# Patient Record
Sex: Female | Born: 1950
Health system: Southern US, Community
[De-identification: ages and names within clinical notes are randomized; demographics above are authoritative.]

## PROBLEM LIST (undated history)

## (undated) DIAGNOSIS — F32A Depression, unspecified: Secondary | ICD-10-CM

## (undated) DIAGNOSIS — F329 Major depressive disorder, single episode, unspecified: Secondary | ICD-10-CM

## (undated) DIAGNOSIS — E079 Disorder of thyroid, unspecified: Secondary | ICD-10-CM

## (undated) DIAGNOSIS — M1990 Unspecified osteoarthritis, unspecified site: Secondary | ICD-10-CM

## (undated) DIAGNOSIS — I10 Essential (primary) hypertension: Secondary | ICD-10-CM

## (undated) DIAGNOSIS — N059 Unspecified nephritic syndrome with unspecified morphologic changes: Secondary | ICD-10-CM

## (undated) HISTORY — DX: Unspecified nephritic syndrome with unspecified morphologic changes: N05.9

## (undated) HISTORY — DX: Major depressive disorder, single episode, unspecified: F32.9

## (undated) HISTORY — DX: Morbid (severe) obesity due to excess calories: E66.01

## (undated) HISTORY — DX: Depression, unspecified: F32.A

## (undated) HISTORY — DX: Disorder of thyroid, unspecified: E07.9

## (undated) HISTORY — DX: Unspecified osteoarthritis, unspecified site: M19.90

## (undated) HISTORY — DX: Essential (primary) hypertension: I10

---
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
## 1998-03-06 HISTORY — PX: JOINT REPLACEMENT: SHX530

## 1998-03-29 ENCOUNTER — Inpatient Hospital Stay (HOSPITAL_COMMUNITY): Admission: RE | Admit: 1998-03-29 | Discharge: 1998-04-02 | Payer: Self-pay | Admitting: Orthopedic Surgery

## 1998-04-05 ENCOUNTER — Encounter (HOSPITAL_COMMUNITY): Admission: RE | Admit: 1998-04-05 | Discharge: 1998-07-04 | Payer: Self-pay | Admitting: Orthopedic Surgery

## 1998-04-08 ENCOUNTER — Emergency Department (HOSPITAL_COMMUNITY): Admission: EM | Admit: 1998-04-08 | Discharge: 1998-04-08 | Payer: Self-pay | Admitting: Emergency Medicine

## 2002-05-04 ENCOUNTER — Ambulatory Visit (HOSPITAL_COMMUNITY): Admission: RE | Admit: 2002-05-04 | Discharge: 2002-05-04 | Payer: Self-pay | Admitting: Anesthesiology

## 2013-05-19 ENCOUNTER — Ambulatory Visit: Payer: No Typology Code available for payment source | Attending: Internal Medicine | Admitting: Internal Medicine

## 2013-05-19 ENCOUNTER — Telehealth: Payer: Self-pay | Admitting: Emergency Medicine

## 2013-05-19 ENCOUNTER — Encounter: Payer: Self-pay | Admitting: Internal Medicine

## 2013-05-19 ENCOUNTER — Ambulatory Visit
Admission: RE | Admit: 2013-05-19 | Discharge: 2013-05-19 | Disposition: A | Discharge status: Home / Self Care | Payer: No Typology Code available for payment source | Source: Ambulatory Visit | Attending: Internal Medicine | Admitting: Internal Medicine

## 2013-05-19 VITALS — BP 153/90 | HR 60 | Temp 97.7°F | Ht 66.0 in | Wt 309.2 lb

## 2013-05-19 DIAGNOSIS — F329 Major depressive disorder, single episode, unspecified: Secondary | ICD-10-CM

## 2013-05-19 DIAGNOSIS — Z0000 Encounter for general adult medical examination without abnormal findings: Secondary | ICD-10-CM

## 2013-05-19 DIAGNOSIS — I10 Essential (primary) hypertension: Secondary | ICD-10-CM | POA: Insufficient documentation

## 2013-05-19 DIAGNOSIS — M170 Bilateral primary osteoarthritis of knee: Secondary | ICD-10-CM

## 2013-05-19 DIAGNOSIS — E039 Hypothyroidism, unspecified: Secondary | ICD-10-CM

## 2013-05-19 DIAGNOSIS — IMO0002 Reserved for concepts with insufficient information to code with codable children: Secondary | ICD-10-CM

## 2013-05-19 DIAGNOSIS — M1710 Unilateral primary osteoarthritis, unspecified knee: Secondary | ICD-10-CM | POA: Insufficient documentation

## 2013-05-19 LAB — CBC WITH DIFFERENTIAL/PLATELET
Basophils Absolute: 0 10*3/uL (ref 0.0–0.1)
Basophils Relative: 1 % (ref 0–1)
HCT: 35.1 % — ABNORMAL LOW (ref 36.0–46.0)
Lymphocytes Relative: 15 % (ref 12–46)
MCHC: 33.3 g/dL (ref 30.0–36.0)
MCV: 72.5 fL — ABNORMAL LOW (ref 78.0–100.0)
Monocytes Absolute: 0.6 10*3/uL (ref 0.1–1.0)
Neutro Abs: 6.3 10*3/uL (ref 1.7–7.7)
Neutrophils Relative %: 72 % (ref 43–77)
Platelets: 167 10*3/uL (ref 150–400)
RBC: 4.84 MIL/uL (ref 3.87–5.11)
RDW: 15.5 % (ref 11.5–15.5)
WBC: 8.8 10*3/uL (ref 4.0–10.5)

## 2013-05-19 LAB — CMP AND LIVER
Albumin: 3.7 g/dL (ref 3.5–5.2)
Bilirubin, Direct: 0.1 mg/dL (ref 0.0–0.3)
CO2: 26 mEq/L (ref 19–32)
Calcium: 9 mg/dL (ref 8.4–10.5)
Creat: 1.56 mg/dL — ABNORMAL HIGH (ref 0.50–1.10)
Glucose, Bld: 92 mg/dL (ref 70–99)
Indirect Bilirubin: 0.5 mg/dL (ref 0.0–0.9)
Potassium: 4.8 mEq/L (ref 3.5–5.3)
Sodium: 137 mEq/L (ref 135–145)
Total Protein: 7.2 g/dL (ref 6.0–8.3)

## 2013-05-19 LAB — LIPID PANEL
HDL: 42 mg/dL (ref >39)
Triglycerides: 233 mg/dL — ABNORMAL HIGH (ref <150)
VLDL: 47 mg/dL — ABNORMAL HIGH (ref 0–40)

## 2013-05-19 LAB — POCT GLYCOSYLATED HEMOGLOBIN (HGB A1C): Hemoglobin A1C: 5.4

## 2013-05-19 MED ORDER — BUPROPION HCL ER (XL) 150 MG PO TB24: 150.0000 mg | ORAL_TABLET | ORAL | Status: DC

## 2013-05-19 NOTE — Progress Notes (Signed)
Patient ID: Frances Graves, female   DOB: 1951-01-12, 62 y.o.   MRN: [id number] Patient Demographics  Frances Graves, is a 62 y.o. female  [id number]  [id number]  DOB - 1951-05-04  CC:  Chief Complaint  Patient presents with  . Establish Care  . Knee Pain       HPI: Frances Graves is a 62 y.o. female here today to establish medical care. She has history of hypertension, osteoarthritis of both knees, hypothyroidism, and major depression. She has not seen a doctor for a while because she lost her insurance. Her medications are only been refilled by physician assistance. She has not had a physical for about 5 years. She has no specific complaint today, she recently had a refill of her medications, but she thinks her antidepressant is not working, she's not motivated to do anything, she lives with her mom who does the cooking. She claims to be independent, can do grocery shopping, but she is just not motivated to do anything. Mostly this is due to her knee pain especially when walking, her knees touched each other, painful on walking. This also precludes her from doing any exercise. She has been on antidepressant for long time, she thinks she now gets used to it and is not helping anymore. She denied being suicidal, she said depression runs in her family. She is not known to have diabetes, no history of heart attack. She does not smoke cigarette, she does not drink alcohol. She does not have children of her own. She had right hip replacement 14 years ago Patient has No headache, No chest pain, No abdominal pain - No Nausea, No new weakness tingling or numbness, No Cough - SOB.  Allergies  Allergen Reactions  . Codeine   . Iodinated Diagnostic Agents   . Mercury   . Morphine And Related   . Tape    No past medical history on file. No current outpatient prescriptions on file prior to visit.   No current facility-administered medications on file prior to visit.   Family History  Problem  Relation Age of Onset  . Cancer Father   . Diabetes Sister   . Hypertension Brother    History   Social History  . Marital Status: Single    Spouse Name: N/A    Number of Children: N/A  . Years of Education: N/A   Occupational History  . Not on file.   Social History Main Topics  . Smoking status: Never Smoker   . Smokeless tobacco: Not on file  . Alcohol Use: Not on file  . Drug Use: Not on file  . Sexual Activity: Not on file   Other Topics Concern  . Not on file   Social History Narrative  . No narrative on file    Review of Systems: Constitutional: Negative for fever, chills, diaphoresis, activity change, appetite change and fatigue. HENT: Negative for ear pain, nosebleeds, congestion, facial swelling, rhinorrhea, neck pain, neck stiffness and ear discharge.  Eyes: Negative for pain, discharge, redness, itching and visual disturbance. Respiratory: Negative for cough, choking, chest tightness, shortness of breath, wheezing and stridor.  Cardiovascular: Negative for chest pain, palpitations and leg swelling. Gastrointestinal: Negative for abdominal distention. Genitourinary: Negative for dysuria, urgency, frequency, hematuria, flank pain, decreased urine volume, difficulty urinating and dyspareunia.  Musculoskeletal: Negative for back pain, bilateral knee pain  Neurological: Negative for dizziness, tremors, seizures, syncope, facial asymmetry, speech difficulty, weakness, light-headedness, numbness and headaches.  Hematological: Negative for adenopathy. Does not  bruise/bleed easily. Psychiatric/Behavioral: She has major depression, lacks motivation to engage in any activity Negative for hallucinations, confusion, decreased concentration and agitation.    Objective:   Filed Vitals:   05/19/13 1215  BP: 153/90  Pulse: 60  Temp: 97.7 F (36.5 C)    Physical Exam: Constitutional: Patient appears well-developed and well-nourished. No distress. Morbidly obese HENT:  Normocephalic, atraumatic, External right and left ear normal. Oropharynx is clear and moist.  Eyes: Conjunctivae and EOM are normal. PERRLA, no scleral icterus. Neck: Normal ROM. Neck supple. No JVD. No tracheal deviation. No thyromegaly. CVS: RRR, S1/S2 +, no murmurs, no gallops, no carotid bruit.  Pulmonary: Effort and breath sounds normal, no stridor, rhonchi, wheezes, rales.  Abdominal: Soft. BS +, no distension, tenderness, rebound or guarding.  Musculoskeletal: Bilateral knee tenderness on flexion and extension  Lymphadenopathy: No lymphadenopathy noted, cervical, inguinal or axillary Neuro: Alert. Normal reflexes, muscle tone coordination. No cranial nerve deficit. Skin: Skin is warm and dry. No rash noted. Not diaphoretic. No erythema. No pallor. Psychiatric: Poor eye contact, judgment, thought content normal.  No results found for this basename: WBC, HGB, HCT, MCV, PLT   No results found for this basename: CREATININE, BUN, NA, K, CL, CO2    No results found for this basename: HGBA1C   Lipid Panel  No results found for this basename: chol, trig, hdl, cholhdl, vldl, ldlcalc       Assessment and plan:   Patient Active Problem List   Diagnosis Date Noted  . Osteoarthritis of both knees 05/19/2013  . Morbid obesity 05/19/2013  . Essential hypertension, benign 05/19/2013  . Major depression, chronic 05/19/2013  . Unspecified hypothyroidism 05/19/2013  . Annual physical exam 05/19/2013    Plan: Bupropion ER 150 mg tablet by mouth daily Continue other medications as prescribed   Labs:  Comprehensive metabolic panel  Complete blood count and differentials  Lipid panel  Complete urinalysis  TSH  Hemoglobin A1c  X-ray both knees  Colonoscopy  Mammogram  Patient extensively counseled about nutrition and exercise Patient counseled about depression, she declined referral to psychiatrist, was to trying the addition Wellbutrin and see. We discussed blood  pressure goals, compliance with medication, complications of uncontrolled hypertension. Since we do not have baseline kidney functions, would defer adjustment of blood pressure medications until we have results of comprehensive metabolic panel.      Health Maintenance -Colonoscopy: Referral Ordered -Pap Smear: Referral to family clinic -Mammogram: Referral Ordered -Vaccinations:   -Influenza given today  Follow up in 2 months   The patient was given clear instructions to go to ER or return to medical center if symptoms don't improve, worsen or new problems develop. The patient verbalized understanding. The patient was told to call to get lab results if they haven't heard anything in the next week.     Jeanann Lewandowsky, MD, MHA, FACP, FAAP [facility] [place], [place] [phone]   05/19/2013, 12:51 PM

## 2013-05-19 NOTE — Telephone Encounter (Signed)
Pt called in to clarify not to take Citalopram with prescribed Wellbutrin per provider

## 2013-05-19 NOTE — Patient Instructions (Signed)
Hypertension As your heart beats, it forces blood through your arteries. This force is your blood pressure. If the pressure is too high, it is called hypertension (HTN) or high blood pressure. HTN is dangerous because you may have it and not know it. High blood pressure may mean that your heart has to work harder to pump blood. Your arteries may be narrow or stiff. The extra work puts you at risk for heart disease, stroke, and other problems.  Blood pressure consists of two numbers, a higher number over a lower, 110/72, for example. It is stated as "110 over 72." The ideal is below 120 for the top number (systolic) and under 80 for the bottom (diastolic). Write down your blood pressure today. You should pay close attention to your blood pressure if you have certain conditions such as:  Heart failure.  Prior heart attack.  Diabetes  Chronic kidney disease.  Prior stroke.  Multiple risk factors for heart disease. To see if you have HTN, your blood pressure should be measured while you are seated with your arm held at the level of the heart. It should be measured at least twice. A one-time elevated blood pressure reading (especially in the Emergency Department) does not mean that you need treatment. There may be conditions in which the blood pressure is different between your right and left arms. It is important to see your caregiver soon for a recheck. Most people have essential hypertension which means that there is not a specific cause. This type of high blood pressure may be lowered by changing lifestyle factors such as:  Stress.  Smoking.  Lack of exercise.  Excessive weight.  Drug/tobacco/alcohol use.  Eating less salt. Most people do not have symptoms from high blood pressure until it has caused damage to the body. Effective treatment can often prevent, delay or reduce that damage. TREATMENT  When a cause has been identified, treatment for high blood pressure is directed at the  cause. There are a large number of medications to treat HTN. These fall into several categories, and your caregiver will help you select the medicines that are best for you. Medications may have side effects. You should review side effects with your caregiver. If your blood pressure stays high after you have made lifestyle changes or started on medicines,   Your medication(s) may need to be changed.  Other problems may need to be addressed.  Be certain you understand your prescriptions, and know how and when to take your medicine.  Be sure to follow up with your caregiver within the time frame advised (usually within two weeks) to have your blood pressure rechecked and to review your medications.  If you are taking more than one medicine to lower your blood pressure, make sure you know how and at what times they should be taken. Taking two medicines at the same time can result in blood pressure that is too low. SEEK IMMEDIATE MEDICAL CARE IF:  You develop a severe headache, blurred or changing vision, or confusion.  You have unusual weakness or numbness, or a faint feeling.  You have severe chest or abdominal pain, vomiting, or breathing problems. MAKE SURE YOU:   Understand these instructions.  Will watch your condition.  Will get help right away if you are not doing well or get worse. Document Released: 07/23/2005 Document Revised: 10/15/2011 Document Reviewed: 03/12/2008 [facility] Patient Information 2014 [place]. DASH Diet The DASH diet stands for "Dietary Approaches to Stop Hypertension." It is a healthy  eating plan that has been shown to reduce high blood pressure (hypertension) in as little as 14 days, while also possibly providing other significant health benefits. These other health benefits include reducing the risk of breast cancer after menopause and reducing the risk of type 2 diabetes, heart disease, colon cancer, and stroke. Health benefits also include weight loss  and slowing kidney failure in patients with chronic kidney disease.  DIET GUIDELINES  Limit salt (sodium). Your diet should contain less than 1500 mg of sodium daily.  Limit refined or processed carbohydrates. Your diet should include mostly whole grains. Desserts and added sugars should be used sparingly.  Include small amounts of heart-healthy fats. These types of fats include nuts, oils, and tub margarine. Limit saturated and trans fats. These fats have been shown to be harmful in the body. CHOOSING FOODS  The following food groups are based on a 2000 calorie diet. See your Registered Dietitian for individual calorie needs. Grains and Grain Products (6 to 8 servings daily)  Eat More Often: Whole-wheat bread, brown rice, whole-grain or wheat pasta, quinoa, popcorn without added fat or salt (air popped).  Eat Less Often: White bread, white pasta, white rice, cornbread. Vegetables (4 to 5 servings daily)  Eat More Often: Fresh, frozen, and canned vegetables. Vegetables may be raw, steamed, roasted, or grilled with a minimal amount of fat.  Eat Less Often/Avoid: Creamed or fried vegetables. Vegetables in a cheese sauce. Fruit (4 to 5 servings daily)  Eat More Often: All fresh, canned (in natural juice), or frozen fruits. Dried fruits without added sugar. One hundred percent fruit juice ( cup [237 mL] daily).  Eat Less Often: Dried fruits with added sugar. Canned fruit in light or heavy syrup. [employer], Fish, and Poultry (2 servings or less daily. One serving is 3 to 4 oz [85-114 g]).  Eat More Often: Ninety percent or leaner ground beef, tenderloin, sirloin. Round cuts of beef, chicken breast, [place] breast. All fish. Grill, bake, or broil your meat. Nothing should be fried.  Eat Less Often/Avoid: Fatty cuts of meat, [place], or chicken leg, thigh, or wing. Fried cuts of meat or fish. Dairy (2 to 3 servings)  Eat More Often: Low-fat or fat-free milk, low-fat plain or light yogurt,  reduced-fat or part-skim cheese.  Eat Less Often/Avoid: Milk (whole, 2%).Whole milk yogurt. Full-fat cheeses. Nuts, Seeds, and Legumes (4 to 5 servings per week)  Eat More Often: All without added salt.  Eat Less Often/Avoid: Salted nuts and seeds, canned beans with added salt. Fats and Sweets (limited)  Eat More Often: Vegetable oils, tub margarines without trans fats, sugar-free gelatin. Mayonnaise and salad dressings.  Eat Less Often/Avoid: Coconut oils, palm oils, butter, stick margarine, cream, half and half, cookies, candy, pie. FOR MORE INFORMATION The Dash Diet Eating Plan: www.dashdiet.org Document Released: 07/12/2011 Document Revised: 10/15/2011 Document Reviewed: 07/12/2011 [facility] Patient Information 2014 [place], [place]. Osteoarthritis Osteoarthritis is the most common form of arthritis. It is redness, soreness, and swelling (inflammation) affecting the cartilage. Cartilage acts as a cushion, covering the ends of bones where they meet to form a joint. CAUSES  Over time, the cartilage begins to wear away. This causes bone to rub on bone. This produces pain and stiffness in the affected joints. Factors that contribute to this problem are:  Excessive body weight.  Age.  Overuse of joints. SYMPTOMS   People with osteoarthritis usually experience joint pain, swelling, or stiffness.  Over time, the joint may lose its normal shape.  Small deposits of bone (osteophytes) may grow on the edges of the joint.  Bits of bone or cartilage can break off and float inside the joint space. This may cause more pain and damage.  Osteoarthritis can lead to depression, anxiety, feelings of helplessness, and limitations on daily activities. The most commonly affected joints are in the:  Ends of the fingers.  Thumbs.  Neck.  Lower back.  Knees.  Hips. DIAGNOSIS  Diagnosis is mostly based on your symptoms and exam. Tests may be helpful, including:  X-rays of the affected  joint.  A computerized magnetic scan (MRI).  Blood tests to rule out other types of arthritis.  Joint fluid tests. This involves using a needle to draw fluid from the joint and examining the fluid under a microscope. TREATMENT  Goals of treatment are to control pain, improve joint function, maintain a normal body weight, and maintain a healthy lifestyle. Treatment approaches may include:  A prescribed exercise program with rest and joint relief.  Weight control with nutritional education.  Pain relief techniques such as:  Properly applied heat and cold.  Electric pulses delivered to nerve endings under the skin (transcutaneous electrical nerve stimulation, TENS).  Massage.  Certain supplements. Ask your caregiver before using any supplements, especially in combination with prescribed drugs.  Medicines to control pain, such as:  Acetaminophen.  Nonsteroidal anti-inflammatory drugs (NSAIDs), such as naproxen.  Narcotic or central-acting agents, such as tramadol. This drug carries a risk of addiction and is generally prescribed for short-term use.  Corticosteroids. These can be given orally or as injection. This is a short-term treatment, not recommended for routine use.  Surgery to reposition the bones and relieve pain (osteotomy) or to remove loose pieces of bone and cartilage. Joint replacement may be needed in advanced states of osteoarthritis. HOME CARE INSTRUCTIONS  Your caregiver can recommend specific types of exercise. These may include:  Strengthening exercises. These are done to strengthen the muscles that support joints affected by arthritis. They can be performed with weights or with exercise bands to add resistance.  Aerobic activities. These are exercises, such as brisk walking or low-impact aerobics, that get your heart pumping. They can help keep your lungs and circulatory system in shape.  Range-of-motion activities. These keep your joints limber.  Balance  and agility exercises. These help you maintain daily living skills. Learning about your condition and being actively involved in your care will help improve the course of your osteoarthritis. SEEK MEDICAL CARE IF:   You feel hot or your skin turns red.  You develop a rash in addition to your joint pain.  You have an oral temperature above 102 F (38.9 C). FOR MORE INFORMATION  National Institute of Arthritis and Musculoskeletal and Skin Diseases: www.niams.http://www.myers.net/ [employer] on Aging: https://walker.com/ American College of Rheumatology: www.rheumatology.org Document Released: 07/23/2005 Document Revised: 10/15/2011 Document Reviewed: 11/03/2009 [facility] Patient Information 2014 [place], [place]. Depression, Adult Depression refers to feeling sad, low, down in the dumps, blue, gloomy, or empty. In general, there are two kinds of depression: 1. Depression that we all experience from time to time because of upsetting life experiences, including the loss of a job or the ending of a relationship (normal sadness or normal grief). This kind of depression is considered normal, is short lived, and resolves within a few days to 2 weeks. (Depression experienced after the loss of a loved one is called bereavement. Bereavement often lasts longer than 2 weeks but normally gets better with time.) 2. Clinical  depression, which lasts longer than normal sadness or normal grief or interferes with your ability to function at home, at work, and in school. It also interferes with your personal relationships. It affects almost every aspect of your life. Clinical depression is an illness. Symptoms of depression also can be caused by conditions other than normal sadness and grief or clinical depression. Examples of these conditions are listed as follows:  Physical illness Some physical illnesses, including underactive thyroid gland (hypothyroidism), severe anemia, specific types of cancer, diabetes, uncontrolled  seizures, heart and lung problems, strokes, and chronic pain are commonly associated with symptoms of depression.  Side effects of some prescription medicine In some people, certain types of prescription medicine can cause symptoms of depression.  Substance abuse Abuse of alcohol and illicit drugs can cause symptoms of depression. SYMPTOMS Symptoms of normal sadness and normal grief include the following:  Feeling sad or crying for short periods of time.  Not caring about anything (apathy).  Difficulty sleeping or sleeping too much.  No longer able to enjoy the things you used to enjoy.  Desire to be by oneself all the time (social isolation).  Lack of energy or motivation.  Difficulty concentrating or remembering.  Change in appetite or weight.  Restlessness or agitation. Symptoms of clinical depression include the same symptoms of normal sadness or normal grief and also the following symptoms:  Feeling sad or crying all the time.  Feelings of guilt or worthlessness.  Feelings of hopelessness or helplessness.  Thoughts of suicide or the desire to harm yourself (suicidal ideation).  Loss of touch with reality (psychotic symptoms). Seeing or hearing things that are not real (hallucinations) or having false beliefs about your life or the people around you (delusions and paranoia). DIAGNOSIS  The diagnosis of clinical depression usually is based on the severity and duration of the symptoms. Your caregiver also will ask you questions about your medical history and substance use to find out if physical illness, use of prescription medicine, or substance abuse is causing your depression. Your caregiver also may order blood tests. TREATMENT  Typically, normal sadness and normal grief do not require treatment. However, sometimes antidepressant medicine is prescribed for bereavement to ease the depressive symptoms until they resolve. The treatment for clinical depression depends on the  severity of your symptoms but typically includes antidepressant medicine, counseling with a mental health professional, or a combination of both. Your caregiver will help to determine what treatment is best for you. Depression caused by physical illness usually goes away with appropriate medical treatment of the illness. If prescription medicine is causing depression, talk with your caregiver about stopping the medicine, decreasing the dose, or substituting another medicine. Depression caused by abuse of alcohol or illicit drugs abuse goes away with abstinence from these substances. Some adults need professional help in order to stop drinking or using drugs. SEEK IMMEDIATE CARE IF:  You have thoughts about hurting yourself or others.  You lose touch with reality (have psychotic symptoms).  You are taking medicine for depression and have a serious side effect. FOR MORE INFORMATION National Alliance on Mental Illness: www.nami.[employer] of Mental Health: http://www.maynard.net/ Document Released: 07/20/2000 Document Revised: 01/22/2012 Document Reviewed: 10/22/2011 [facility] Patient Information 2014 [place], [place].

## 2013-05-20 LAB — URINALYSIS, COMPLETE
Bacteria, UA: NONE SEEN
Casts: NONE SEEN
Crystals: NONE SEEN
Glucose, UA: NEGATIVE mg/dL
Hgb urine dipstick: NEGATIVE
Ketones, ur: NEGATIVE mg/dL
Nitrite: NEGATIVE
Specific Gravity, Urine: 1.011 (ref 1.005–1.030)
Urobilinogen, UA: 0.2 mg/dL (ref 0.0–1.0)

## 2013-05-20 LAB — TSH: TSH: 2.664 u[IU]/mL (ref 0.350–4.500)

## 2013-05-22 ENCOUNTER — Telehealth: Payer: Self-pay

## 2013-05-22 NOTE — Telephone Encounter (Signed)
Tried to call patient to schedule an appointment Left message on machine to return call

## 2013-05-22 NOTE — Telephone Encounter (Signed)
Message copied by Lestine Mount on Fri May 22, 2013 10:31 AM ------      Message from: Quentin Angst      Created: Fri May 22, 2013 10:02 AM       Please tell patient to schedule an appointment for a visit to discuss lab results ------

## 2013-05-27 ENCOUNTER — Telehealth: Payer: Self-pay | Admitting: Emergency Medicine

## 2013-05-27 NOTE — Telephone Encounter (Signed)
Message copied by Darlis Loan on Wed May 27, 2013 10:02 AM ------      Message from: Quentin Angst      Created: Fri May 22, 2013 10:02 AM       Please tell patient to schedule an appointment for a visit to discuss lab results ------

## 2013-05-27 NOTE — Telephone Encounter (Signed)
2nd message left for pt to schedule appt to discuss lab results

## 2013-05-28 NOTE — Progress Notes (Signed)
appt for lab results scheduled 06/09/13

## 2013-05-28 NOTE — Progress Notes (Signed)
appt scheduled for 06/09/13

## 2013-06-09 ENCOUNTER — Encounter: Payer: Self-pay | Admitting: Internal Medicine

## 2013-06-09 ENCOUNTER — Ambulatory Visit: Payer: No Typology Code available for payment source | Attending: Internal Medicine | Admitting: Internal Medicine

## 2013-06-09 VITALS — BP 152/96 | HR 72 | Temp 97.9°F | Resp 14 | Ht 65.0 in | Wt 315.0 lb

## 2013-06-09 DIAGNOSIS — D649 Anemia, unspecified: Secondary | ICD-10-CM | POA: Insufficient documentation

## 2013-06-09 DIAGNOSIS — M1710 Unilateral primary osteoarthritis, unspecified knee: Secondary | ICD-10-CM

## 2013-06-09 DIAGNOSIS — M170 Bilateral primary osteoarthritis of knee: Secondary | ICD-10-CM

## 2013-06-09 DIAGNOSIS — F3289 Other specified depressive episodes: Secondary | ICD-10-CM | POA: Insufficient documentation

## 2013-06-09 DIAGNOSIS — I129 Hypertensive chronic kidney disease with stage 1 through stage 4 chronic kidney disease, or unspecified chronic kidney disease: Secondary | ICD-10-CM | POA: Insufficient documentation

## 2013-06-09 DIAGNOSIS — IMO0002 Reserved for concepts with insufficient information to code with codable children: Secondary | ICD-10-CM

## 2013-06-09 DIAGNOSIS — F329 Major depressive disorder, single episode, unspecified: Secondary | ICD-10-CM

## 2013-06-09 DIAGNOSIS — E039 Hypothyroidism, unspecified: Secondary | ICD-10-CM

## 2013-06-09 DIAGNOSIS — N182 Chronic kidney disease, stage 2 (mild): Secondary | ICD-10-CM | POA: Insufficient documentation

## 2013-06-09 DIAGNOSIS — Z0000 Encounter for general adult medical examination without abnormal findings: Secondary | ICD-10-CM

## 2013-06-09 DIAGNOSIS — I10 Essential (primary) hypertension: Secondary | ICD-10-CM

## 2013-06-09 MED ORDER — OMEPRAZOLE 20 MG PO CPDR: 20.0000 mg | DELAYED_RELEASE_CAPSULE | Freq: Every day | ORAL | Status: DC

## 2013-06-09 MED ORDER — LEVOTHYROXINE SODIUM 50 MCG PO TABS: 50.0000 ug | ORAL_TABLET | Freq: Every day | ORAL | Status: DC

## 2013-06-09 MED ORDER — BUPROPION HCL ER (XL) 150 MG PO TB24: 150.0000 mg | ORAL_TABLET | ORAL | Status: DC

## 2013-06-09 MED ORDER — FUROSEMIDE 40 MG PO TABS: 40.0000 mg | ORAL_TABLET | Freq: Every day | ORAL | Status: DC

## 2013-06-09 MED ORDER — ATENOLOL 50 MG PO TABS: 50.0000 mg | ORAL_TABLET | Freq: Every day | ORAL | Status: DC

## 2013-06-09 MED ORDER — ASPIRIN 81 MG PO TABS: 81.0000 mg | ORAL_TABLET | Freq: Every day | ORAL | Status: DC

## 2013-06-09 MED ORDER — LORATADINE 10 MG PO TABS: 10.0000 mg | ORAL_TABLET | Freq: Every day | ORAL | Status: DC

## 2013-06-09 MED ORDER — DICLOFENAC SODIUM 75 MG PO TBEC: 75.0000 mg | DELAYED_RELEASE_TABLET | Freq: Two times a day (BID) | ORAL | Status: DC | PRN

## 2013-06-09 MED ORDER — ACETAMINOPHEN 500 MG PO TABS: 1000.0000 mg | ORAL_TABLET | Freq: Two times a day (BID) | ORAL | Status: DC

## 2013-06-09 MED ORDER — AMLODIPINE BESYLATE 10 MG PO TABS: 10.0000 mg | ORAL_TABLET | Freq: Every day | ORAL | Status: DC

## 2013-06-09 NOTE — Progress Notes (Signed)
Patient Demographics  Frances Graves, is a 62 y.o. female  ZOX:[id number]  [id number]:[id number]  DOB - 1950-12-25  Chief Complaint  Patient presents with  . Follow-up        Subjective:   Frances Graves today is here for a follow up visit. She was recently seen in the clinic and is here to followup on her lab results. She has no new major complaints.  Patient has No headache, No chest pain, No abdominal pain - No Nausea, No new weakness tingling or numbness, No Cough - SOB.   Objective:    Filed Vitals:   06/09/13 1529  BP: 152/96  Pulse: 72  Temp: 97.9 F (36.6 C)  TempSrc: Oral  Resp: 14  Height: 5\' 5"  (1.651 m)  Weight: 315 lb (142.883 kg)  SpO2: 93%     ALLERGIES:   Allergies  Allergen Reactions  . Codeine   . Iodinated Diagnostic Agents   . Mercury   . Morphine And Related   . Tape     PAST MEDICAL HISTORY: History reviewed. No pertinent past medical history.  MEDICATIONS AT HOME: Prior to Admission medications   Medication Sig Start Date End Date Taking? Authorizing Provider  aspirin 81 MG tablet Take 1 tablet (81 mg total) by mouth daily. 06/09/13  Yes Ashten Sarnowski Levora Dredge, MD  atenolol (TENORMIN) 50 MG tablet Take 1 tablet (50 mg total) by mouth daily. 06/09/13  Yes Lexxie Winberg Levora Dredge, MD  buPROPion (WELLBUTRIN XL) 150 MG 24 hr tablet Take 1 tablet (150 mg total) by mouth every morning. 06/09/13  Yes Piera Downs Levora Dredge, MD  diclofenac (VOLTAREN) 75 MG EC tablet Take 1 tablet (75 mg total) by mouth 2 (two) times daily as needed for mild pain. 06/09/13  Yes Shiah Berhow Levora Dredge, MD  furosemide (LASIX) 40 MG tablet Take 1 tablet (40 mg total) by mouth daily. Take 1 1/2 tabs daily 06/09/13  Yes Patricia Perales Levora Dredge, MD  levothyroxine (SYNTHROID, LEVOTHROID) 50 MCG tablet Take 1 tablet (50 mcg total) by mouth daily before breakfast. 06/09/13  Yes Jarrin Staley Levora Dredge, MD  loratadine (CLARITIN) 10 MG tablet Take 1 tablet (10 mg total) by mouth daily. 06/09/13  Yes Delta Deshmukh Levora Dredge, MD   omeprazole (PRILOSEC) 20 MG capsule Take 1 capsule (20 mg total) by mouth daily. 06/09/13  Yes Shaune Malacara Levora Dredge, MD  amLODipine (NORVASC) 10 MG tablet Take 1 tablet (10 mg total) by mouth daily. 06/09/13   Marlette Curvin Levora Dredge, MD     Exam  General appearance :Awake, alert, not in any distress. Speech Clear. Not toxic Looking HEENT: Atraumatic and Normocephalic, pupils equally reactive to light and accomodation Neck: supple, no JVD. No cervical lymphadenopathy.  Chest:Good air entry bilaterally, no added sounds  CVS: S1 S2 regular, no murmurs.  Abdomen: Bowel sounds present, Non tender and not distended with no gaurding, rigidity or rebound. Extremities: B/L Lower Ext shows no edema, both legs are warm to touch Neurology: Awake alert, and oriented X 3, CN II-XII intact, Non focal Skin:No Rash Wounds:N/A    Data Review   CBC No results found for this basename: WBC, HGB, HCT, PLT, MCV, MCH, MCHC, RDW, NEUTRABS, LYMPHSABS, MONOABS, EOSABS, BASOSABS, BANDABS, BANDSABD,  in the last 168 hours  Chemistries   No results found for this basename: NA, K, CL, CO2, GLUCOSE, BUN, CREATININE, GFRCGP, CALCIUM, MG, AST, ALT, ALKPHOS, BILITOT,  in the last 168 hours ------------------------------------------------------------------------------------------------------------------ No results found for this basename: HGBA1C,  in the last 72 hours ------------------------------------------------------------------------------------------------------------------  No results found for this basename: CHOL, HDL, LDLCALC, TRIG, CHOLHDL, LDLDIRECT,  in the last 72 hours ------------------------------------------------------------------------------------------------------------------ No results found for this basename: TSH, T4TOTAL, FREET3, T3FREE, THYROIDAB,  in the last 72 hours ------------------------------------------------------------------------------------------------------------------ No results found  for this basename: VITAMINB12, FOLATE, FERRITIN, TIBC, IRON, RETICCTPCT,  in the last 72 hours  Coagulation profile  No results found for this basename: INR, PROTIME,  in the last 168 hours    Assessment & Plan   Hypertension - Will continue with Lasix 40 mg, continue with atenolol 50 mg daily - Given her chronic kidney disease will stop lisinopril l and switch her to amlodipine 10 mg daily - Will reassess BP control next visit  Depression - She requests addition of another medication, continue with Wellbutrin. I will request a psychiatric referral to  Chronic kidney disease stage II - Suspect secondary to hypertensive nephrosclerosis-urinalysis does not show any proteinuria - We need to adjust her medications-will stop lisinopril and switch her to amlodipine, she uses diclofenac twice a day for Disabling arthritis-I've asked her to make it as needed and try Tylenol first, we will continue with Lasix cautiously  - We'll repeat another chemistries in the next few months, depending on her subsequent chemistry she may need to follow his referral  Anemia - Mild - Check anemia panel prior to next visit - FOBT stool - Refer to GI for screening colonoscopy  Hypothyroidism - Usage within normal limits, continue current dozing off levothyroxine  Osteoarthritis bilateral knees - Try Tylenol 1000 mg twice a day, if not relieved then cautiously use diclofenac - Will refer to sports medicine to see if she is a candidate for intra-articular steroid injections.  Obesity - Counseled regarding the importance of weight loss   Health Maintenance -Colonoscopy: Will refer to GI -Pap Smear: Will refer to GYN -Mammogram: Ordered   Follow up in 6 weeks   The patient was given clear instructions to go to ER or return to medical center if symptoms don't improve, worsen or new problems develop. The patient verbalized understanding. The patient was told to call to get lab results if they haven't  heard anything in the next week.

## 2013-06-09 NOTE — Progress Notes (Signed)
Pt is here for a f/u visit. Pt is here today to review her lab results.

## 2013-06-10 ENCOUNTER — Telehealth: Payer: Self-pay

## 2013-06-10 ENCOUNTER — Other Ambulatory Visit: Payer: Self-pay | Admitting: Internal Medicine

## 2013-06-10 DIAGNOSIS — Z1231 Encounter for screening mammogram for malignant neoplasm of breast: Secondary | ICD-10-CM

## 2013-06-10 NOTE — Telephone Encounter (Signed)
Patient dropped off stool sample Neg for occult blood on card test Sample sent to lab

## 2013-06-12 ENCOUNTER — Telehealth: Payer: Self-pay | Admitting: Emergency Medicine

## 2013-06-12 NOTE — Telephone Encounter (Signed)
Dr. Jerral Ralph, You wrote for pt to take lasix 40 mg tab daily then take 1 1/2 tabs daily.  Please clarify

## 2013-06-22 ENCOUNTER — Encounter: Payer: Self-pay | Admitting: Sports Medicine

## 2013-06-22 ENCOUNTER — Ambulatory Visit (INDEPENDENT_AMBULATORY_CARE_PROVIDER_SITE_OTHER): Payer: No Typology Code available for payment source | Admitting: Sports Medicine

## 2013-06-22 VITALS — BP 165/94 | HR 65 | Ht 65.0 in | Wt 315.0 lb

## 2013-06-22 DIAGNOSIS — M1712 Unilateral primary osteoarthritis, left knee: Secondary | ICD-10-CM

## 2013-06-22 DIAGNOSIS — M1710 Unilateral primary osteoarthritis, unspecified knee: Secondary | ICD-10-CM

## 2013-06-22 DIAGNOSIS — IMO0002 Reserved for concepts with insufficient information to code with codable children: Secondary | ICD-10-CM

## 2013-06-22 DIAGNOSIS — M1711 Unilateral primary osteoarthritis, right knee: Secondary | ICD-10-CM

## 2013-06-22 MED ORDER — METHYLPREDNISOLONE ACETATE 40 MG/ML IJ SUSP
40.0000 mg | Freq: Once | INTRAMUSCULAR | Status: AC
  Administered 2013-06-22: 40 mg via INTRA_ARTICULAR

## 2013-06-22 NOTE — Progress Notes (Signed)
  Subjective:    Patient ID: Frances Graves, female    DOB: May 08, 1951, 62 y.o.   MRN: [id number]  HPI Patient is a 62 yo new patient who presents for evaluation of bilateral knee pain related to OA. Patient has had pain in her knees for the past 5 years. Notes the whole knee hurts. She is unable to specify a specific point. States it can be worse with the weather and with weight bearing/walking. She notes that she has knock knees. She notes heat helps. She has been on diclofenac, which she states helps with the pain, though her PCP wants her to back off on this due to her kidneys. She endorses occasional popping and cracking. She denies weakness.  PMH: HTN, GERD, depression PSHx: right hip replacement 1999  Review of Systems see HPI     Objective:   Physical Exam Obese, well nourished, well developed  Left knee: exam limited by body habitus, may have mild effusion over the medial aspect of her knee, mild tenderness to palpation over the medial aspect of her anterior knee, difficult to appreciate medial and lateral joint lines, no crepitus noted, no ligamentous laxity, pain without pop on mcmurray, good range of motion noted on flexion and extension, 5/5 strength in hip flexors, quads, hamstrings, plantarflexion, and dorsiflexion, neurovascularly intact  Right knee: exam limited by body habitus, no effusion noted, no tenderness noted, difficult to appreciate medial and lateral joint lines, mild crepitus noted, no ligamentous laxity, pain without pop on mcmurray, good range of motion noted on flexion and extension, 5/5 strength in hip flexors, quads, hamstrings, plantarflexion, and dorsiflexion, neurovascularly intact  Note patient uses a cane with her gait. She is in valgus bilaterally at her knees with standing.       Assessment & Plan:  Bilateral knee osteoarthritis Hypertension  Consent obtained and verified. Cleansed with alcohol. Topical analgesic spray: Ethyl chloride. Joint:  left knee anterior lateral Completed without difficulty Meds: 3 cc xylocaine, 1 cc (40 mg) depomedrol Needle: 25 gauge 1.5 inch Aftercare instructions and Red flags advised.  Consent obtained and verified. Cleansed with alcohol. Topical analgesic spray: Ethyl chloride. Joint: right knee anterior lateral Completed without difficulty Meds: 3 cc xylocaine, 1 cc (40 mg) depomedrol Needle: 25 gauge 1.5 inch Aftercare instructions and Red flags advised.  Injections as above. Discussed potential for steroid flare following the injections. Discussed further PO pain management per PCP given patients renal function. Patient to continue to use cane or walker to help with mobility. She is to follow-up as needed for this issue. Also discussed follow-up for hypertension and patient has follow-up scheduled with her PCP in the middle of December for this issue.

## 2013-07-14 ENCOUNTER — Ambulatory Visit
Admission: RE | Admit: 2013-07-14 | Discharge: 2013-07-14 | Disposition: A | Discharge status: Home / Self Care | Payer: No Typology Code available for payment source | Source: Ambulatory Visit | Attending: Internal Medicine | Admitting: Internal Medicine

## 2013-07-14 DIAGNOSIS — Z1231 Encounter for screening mammogram for malignant neoplasm of breast: Secondary | ICD-10-CM

## 2013-07-21 ENCOUNTER — Encounter: Payer: Self-pay | Admitting: Internal Medicine

## 2013-07-21 ENCOUNTER — Ambulatory Visit: Payer: No Typology Code available for payment source | Attending: Internal Medicine | Admitting: Internal Medicine

## 2013-07-21 VITALS — BP 126/81 | HR 91 | Temp 97.9°F | Resp 16 | Ht 66.0 in | Wt 309.0 lb

## 2013-07-21 DIAGNOSIS — N182 Chronic kidney disease, stage 2 (mild): Secondary | ICD-10-CM | POA: Insufficient documentation

## 2013-07-21 DIAGNOSIS — I129 Hypertensive chronic kidney disease with stage 1 through stage 4 chronic kidney disease, or unspecified chronic kidney disease: Secondary | ICD-10-CM | POA: Insufficient documentation

## 2013-07-21 DIAGNOSIS — G8929 Other chronic pain: Secondary | ICD-10-CM

## 2013-07-21 DIAGNOSIS — F3289 Other specified depressive episodes: Secondary | ICD-10-CM | POA: Insufficient documentation

## 2013-07-21 DIAGNOSIS — M25569 Pain in unspecified knee: Secondary | ICD-10-CM

## 2013-07-21 DIAGNOSIS — E039 Hypothyroidism, unspecified: Secondary | ICD-10-CM | POA: Insufficient documentation

## 2013-07-21 DIAGNOSIS — F329 Major depressive disorder, single episode, unspecified: Secondary | ICD-10-CM | POA: Insufficient documentation

## 2013-07-21 DIAGNOSIS — I10 Essential (primary) hypertension: Secondary | ICD-10-CM

## 2013-07-21 LAB — COMPLETE METABOLIC PANEL WITH GFR
ALT: 18 U/L (ref 0–35)
Albumin: 3.8 g/dL (ref 3.5–5.2)
Alkaline Phosphatase: 140 U/L — ABNORMAL HIGH (ref 39–117)
BUN: 25 mg/dL — ABNORMAL HIGH (ref 6–23)
CO2: 27 mEq/L (ref 19–32)
Calcium: 9 mg/dL (ref 8.4–10.5)
Creat: 1.85 mg/dL — ABNORMAL HIGH (ref 0.50–1.10)
GFR, Est African American: 33 mL/min — ABNORMAL LOW
Glucose, Bld: 97 mg/dL (ref 70–99)
Sodium: 142 mEq/L (ref 135–145)
Total Bilirubin: 0.5 mg/dL (ref 0.3–1.2)
Total Protein: 7.3 g/dL (ref 6.0–8.3)

## 2013-07-21 MED ORDER — OMEPRAZOLE 20 MG PO CPDR: 20.0000 mg | DELAYED_RELEASE_CAPSULE | Freq: Every day | ORAL | Status: DC

## 2013-07-21 MED ORDER — LORATADINE 10 MG PO TABS: 10.0000 mg | ORAL_TABLET | Freq: Every day | ORAL | Status: DC

## 2013-07-21 MED ORDER — FUROSEMIDE 40 MG PO TABS: 40.0000 mg | ORAL_TABLET | Freq: Every day | ORAL | Status: DC

## 2013-07-21 MED ORDER — ATENOLOL 50 MG PO TABS: 50.0000 mg | ORAL_TABLET | Freq: Every day | ORAL | Status: AC

## 2013-07-21 MED ORDER — BUPROPION HCL 75 MG PO TABS: 150.0000 mg | ORAL_TABLET | Freq: Two times a day (BID) | ORAL | Status: DC

## 2013-07-21 MED ORDER — LEVOTHYROXINE SODIUM 50 MCG PO TABS: 50.0000 ug | ORAL_TABLET | Freq: Every day | ORAL | Status: DC

## 2013-07-21 NOTE — Progress Notes (Signed)
Pt here for medication adjustment Wellbutrin and refill on BP meds Bp recheck today 126/81 Knee pain

## 2013-07-21 NOTE — Progress Notes (Signed)
Patient ID: Frances Graves, female   DOB: 10-Dec-1950, 62 y.o.   MRN: [id number]   CC:  HPI:  Patient here for multiple reasons. 1 she needs a refill on her medications. She also tells me that she was told that her creatinine was elevated. Her lisinopril was discontinued and she was switched to Norvasc. She also continues to have right knee pain and she saw a sports medicine on 11/17 and had a right knee joint steroid injection that has not helped  She states that her antidepressant was recently switched from Zoloft to Wellbutrin. She continues to have no motivation. However she has lost weight on Wellbutrin. She typically forgets to take her evening dose. She could not afford the extended release because it was expensive. Currently she is only taking 150 mg a day    Allergies  Allergen Reactions  . Codeine   . Iodinated Diagnostic Agents   . Mercury   . Morphine And Related   . Tape    Past Medical History  Diagnosis Date  . Thyroid disease   . Hypertension   . Depression   . Arthritis   . Morbid obesity    Current Outpatient Prescriptions on File Prior to Visit  Medication Sig Dispense Refill  . acetaminophen (TYLENOL) 500 MG tablet Take 2 tablets (1,000 mg total) by mouth 2 (two) times daily.  90 tablet  3  . amLODipine (NORVASC) 10 MG tablet Take 1 tablet (10 mg total) by mouth daily.  90 tablet  3  . aspirin 81 MG tablet Take 1 tablet (81 mg total) by mouth daily.  30 tablet     No current facility-administered medications on file prior to visit.   Family History  Problem Relation Age of Onset  . Cancer Father   . Diabetes Sister   . Hypertension Brother    History   Social History  . Marital Status: Single    Spouse Name: N/A    Number of Children: N/A  . Years of Education: N/A   Occupational History  . Not on file.   Social History Main Topics  . Smoking status: Never Smoker   . Smokeless tobacco: Never Used  . Alcohol Use: Not on file  . Drug Use:  Not on file  . Sexual Activity: Not on file   Other Topics Concern  . Not on file   Social History Narrative  . No narrative on file    Review of Systems  Constitutional: Negative for fever, chills, diaphoresis, activity change, appetite change and fatigue.  HENT: Negative for ear pain, nosebleeds, congestion, facial swelling, rhinorrhea, neck pain, neck stiffness and ear discharge.   Eyes: Negative for pain, discharge, redness, itching and visual disturbance.  Respiratory: Negative for cough, choking, chest tightness, shortness of breath, wheezing and stridor.   Cardiovascular: Negative for chest pain, palpitations and leg swelling.  Gastrointestinal: Negative for abdominal distention.  Genitourinary: Negative for dysuria, urgency, frequency, hematuria, flank pain, decreased urine volume, difficulty urinating and dyspareunia.  Musculoskeletal: Negative for back pain, joint swelling, arthralgias, positive for right knee pain Neurological: Negative for dizziness, tremors, seizures, syncope, facial asymmetry, speech difficulty, weakness, light-headedness, numbness and headaches.  Hematological: Negative for adenopathy. Does not bruise/bleed easily.  Psychiatric/Behavioral: Negative for hallucinations, positive for depression, confusion, dysphoric mood, decreased concentration and agitation.    Objective:   Filed Vitals:   07/21/13 1229  BP: 126/81  Pulse: 91  Temp: 97.9 F (36.6 C)  Resp: 16  Physical Exam  Constitutional: Appears well-developed and well-nourished. No distress.  HENT: Normocephalic. External right and left ear normal. Oropharynx is clear and moist.  Eyes: Conjunctivae and EOM are normal. PERRLA, no scleral icterus.  Neck: Normal ROM. Neck supple. No JVD. No tracheal deviation. No thyromegaly.  CVS: RRR, S1/S2 +, no murmurs, no gallops, no carotid bruit.  Pulmonary: Effort and breath sounds normal, no stridor, rhonchi, wheezes, rales.  Abdominal: Soft. BS +,   no distension, tenderness, rebound or guarding.  Musculoskeletal: Normal range of motion. No edema and no tenderness.  Lymphadenopathy: No lymphadenopathy noted, cervical, inguinal. Neuro: Alert. Normal reflexes, muscle tone coordination. No cranial nerve deficit. Skin: Skin is warm and dry. No rash noted. Not diaphoretic. No erythema. No pallor.  Psychiatric: Normal mood and affect. Behavior, judgment, thought content normal.   Lab Results  Component Value Date   WBC 8.8 05/19/2013   HGB 11.7* 05/19/2013   HCT 35.1* 05/19/2013   MCV 72.5* 05/19/2013   PLT 167 05/19/2013   Lab Results  Component Value Date   CREATININE 1.56* 05/19/2013   BUN 24* 05/19/2013   NA 137 05/19/2013   K 4.8 05/19/2013   CL 103 05/19/2013   CO2 26 05/19/2013    Lab Results  Component Value Date   HGBA1C 5.4 05/19/2013   Lipid Panel     Component Value Date/Time   CHOL 220* 05/19/2013 1253   TRIG 233* 05/19/2013 1253   HDL 42 05/19/2013 1253   CHOLHDL 5.2 05/19/2013 1253   VLDL 47* 05/19/2013 1253   LDLCALC 131* 05/19/2013 1253       Assessment and plan:   Patient Active Problem List   Diagnosis Date Noted  . Osteoarthritis of both knees 05/19/2013  . Morbid obesity 05/19/2013  . Essential hypertension, benign 05/19/2013  . Major depression, chronic 05/19/2013  . Unspecified hypothyroidism 05/19/2013  . Annual physical exam 05/19/2013       Hypertension well controlled Will repeat creatinine today Medications reviewed, continue current regimen   Depression Psychiatry referral provided again Patient encouraged to continue Wellbutrin rather than switching her antidepressant medications over and over again  Chronic kidney disease stage II Will repeat kidney function today  Hypothyroidism Continue current dose of Synthroid Repeat TSH and free T4  Bilateral knee pain Patient to receive referral for orthopedics and physical therapy  Morbid obesity Encouraged weight loss and  we'll exercises to help lose weight and this would also help her with her depression  Follow up in 2 months   The patient was given clear instructions to go to ER or return to medical center if symptoms don't improve, worsen or new problems develop. The patient verbalized understanding. The patient was told to call to get any lab results if not heard anything in the next week.

## 2013-07-23 MED ORDER — FUROSEMIDE 40 MG PO TABS: 20.0000 mg | ORAL_TABLET | Freq: Every day | ORAL | Status: AC

## 2013-07-23 NOTE — Addendum Note (Signed)
Addended by: Susie Cassette MD, Germain Osgood on: 07/23/2013 04:08 PM   Modules accepted: Orders

## 2013-07-24 ENCOUNTER — Telehealth: Payer: Self-pay | Admitting: *Deleted

## 2013-07-24 NOTE — Telephone Encounter (Signed)
Contacted pt to notify her of her lab results. Left a message to give [place] a call back.

## 2013-07-27 ENCOUNTER — Encounter: Payer: Self-pay | Admitting: Internal Medicine

## 2013-07-28 ENCOUNTER — Telehealth: Payer: Self-pay | Admitting: Emergency Medicine

## 2013-07-28 NOTE — Telephone Encounter (Signed)
Pt given lab results. Informed pt to decrease Lasix 40mg  dose to 20 daily and return 08/11/13 for repeat cmp

## 2013-08-11 ENCOUNTER — Ambulatory Visit: Payer: No Typology Code available for payment source | Attending: Internal Medicine

## 2013-08-11 DIAGNOSIS — F329 Major depressive disorder, single episode, unspecified: Secondary | ICD-10-CM

## 2013-08-11 DIAGNOSIS — E039 Hypothyroidism, unspecified: Secondary | ICD-10-CM

## 2013-08-11 DIAGNOSIS — M170 Bilateral primary osteoarthritis of knee: Secondary | ICD-10-CM

## 2013-08-11 DIAGNOSIS — I10 Essential (primary) hypertension: Secondary | ICD-10-CM

## 2013-08-11 DIAGNOSIS — Z0000 Encounter for general adult medical examination without abnormal findings: Secondary | ICD-10-CM

## 2013-08-12 LAB — ANEMIA PANEL
%SAT: 48 % (ref 20–55)
ABS RETIC: 60.5 10*3/uL (ref 19.0–186.0)
FERRITIN: 11 ng/mL (ref 10–291)
Folate: 7.2 ng/mL
Iron: 203 ug/dL — ABNORMAL HIGH (ref 42–145)
RBC.: 4.65 MIL/uL (ref 3.87–5.11)
RETIC CT PCT: 1.3 % (ref 0.4–2.3)
TIBC: 419 ug/dL (ref 250–470)
UIBC: 216 ug/dL (ref 125–400)
Vitamin B-12: 290 pg/mL (ref 211–911)

## 2013-08-25 ENCOUNTER — Telehealth: Payer: Self-pay | Admitting: Internal Medicine

## 2013-08-25 ENCOUNTER — Telehealth: Payer: Self-pay | Admitting: Emergency Medicine

## 2013-08-25 NOTE — Telephone Encounter (Signed)
Pt given lab results. Scheduled repeat BMP for 09/01/13

## 2013-08-25 NOTE — Telephone Encounter (Signed)
Pt given lab results and scheduled for repeat BMP 09/01/13

## 2013-08-25 NOTE — Telephone Encounter (Signed)
Pt is calling in today to get the results of her lab work; please f/u with pt

## 2013-09-01 ENCOUNTER — Ambulatory Visit: Payer: Self-pay | Attending: Internal Medicine

## 2013-09-01 DIAGNOSIS — Z0000 Encounter for general adult medical examination without abnormal findings: Secondary | ICD-10-CM

## 2013-09-02 ENCOUNTER — Telehealth: Payer: Self-pay | Admitting: *Deleted

## 2013-09-02 LAB — BASIC METABOLIC PANEL
BUN: 18 mg/dL (ref 6–23)
CHLORIDE: 104 meq/L (ref 96–112)
CO2: 26 mEq/L (ref 19–32)
Calcium: 9 mg/dL (ref 8.4–10.5)
Creat: 1.51 mg/dL — ABNORMAL HIGH (ref 0.50–1.10)
Glucose, Bld: 99 mg/dL (ref 70–99)
Potassium: 3.9 mEq/L (ref 3.5–5.3)
SODIUM: 139 meq/L (ref 135–145)

## 2013-09-02 NOTE — Telephone Encounter (Signed)
Message copied by Joan Mayans on Wed Sep 02, 2013  6:08 PM ------      Message from: Angelica Chessman E      Created: Wed Sep 02, 2013  9:33 AM       Please inform patient that her creatinine level is still high but slightly better than it was one month ago. This most likely correlates to chronic kidney disease and needs to be monitored closely ------

## 2013-09-02 NOTE — Telephone Encounter (Signed)
I informed the pt of her lab results. I told the patient that her creatinine level is still high but slightly better than it was one month ago.

## 2013-09-21 ENCOUNTER — Ambulatory Visit: Payer: Self-pay | Admitting: Internal Medicine

## 2014-06-08 ENCOUNTER — Other Ambulatory Visit: Payer: Self-pay

## 2014-06-08 DIAGNOSIS — Z1231 Encounter for screening mammogram for malignant neoplasm of breast: Secondary | ICD-10-CM

## 2014-07-15 ENCOUNTER — Ambulatory Visit: Payer: Self-pay

## 2014-07-20 ENCOUNTER — Other Ambulatory Visit: Payer: Self-pay | Admitting: Physician Assistant

## 2014-07-20 DIAGNOSIS — N19 Unspecified kidney failure: Secondary | ICD-10-CM

## 2014-07-21 ENCOUNTER — Ambulatory Visit
Admission: RE | Admit: 2014-07-21 | Discharge: 2014-07-21 | Disposition: A | Discharge status: Home / Self Care | Payer: BC Managed Care – PPO | Source: Ambulatory Visit | Attending: Physician Assistant | Admitting: Physician Assistant

## 2014-07-21 DIAGNOSIS — N19 Unspecified kidney failure: Secondary | ICD-10-CM

## 2014-07-27 ENCOUNTER — Ambulatory Visit
Admission: RE | Admit: 2014-07-27 | Discharge: 2014-07-27 | Disposition: A | Discharge status: Home / Self Care | Payer: BC Managed Care – PPO | Source: Ambulatory Visit

## 2014-07-27 DIAGNOSIS — Z1231 Encounter for screening mammogram for malignant neoplasm of breast: Secondary | ICD-10-CM

## 2014-12-29 ENCOUNTER — Ambulatory Visit: Payer: BLUE CROSS/BLUE SHIELD | Admitting: Anesthesiology

## 2015-02-09 ENCOUNTER — Ambulatory Visit: Payer: BLUE CROSS/BLUE SHIELD | Attending: Anesthesiology | Admitting: Anesthesiology

## 2015-02-09 ENCOUNTER — Encounter (INDEPENDENT_AMBULATORY_CARE_PROVIDER_SITE_OTHER): Payer: Self-pay

## 2015-02-09 ENCOUNTER — Encounter: Payer: Self-pay | Admitting: Anesthesiology

## 2015-02-09 VITALS — BP 120/93 | HR 69 | Temp 98.0°F | Resp 18 | Ht 65.0 in | Wt 300.0 lb

## 2015-02-09 DIAGNOSIS — F321 Major depressive disorder, single episode, moderate: Secondary | ICD-10-CM

## 2015-02-09 DIAGNOSIS — M25561 Pain in right knee: Secondary | ICD-10-CM | POA: Diagnosis present

## 2015-02-09 DIAGNOSIS — M170 Bilateral primary osteoarthritis of knee: Secondary | ICD-10-CM | POA: Insufficient documentation

## 2015-02-09 DIAGNOSIS — F329 Major depressive disorder, single episode, unspecified: Secondary | ICD-10-CM | POA: Diagnosis not present

## 2015-02-09 DIAGNOSIS — M1712 Unilateral primary osteoarthritis, left knee: Secondary | ICD-10-CM

## 2015-02-09 DIAGNOSIS — M21069 Valgus deformity, not elsewhere classified, unspecified knee: Secondary | ICD-10-CM | POA: Insufficient documentation

## 2015-02-09 DIAGNOSIS — M1711 Unilateral primary osteoarthritis, right knee: Secondary | ICD-10-CM

## 2015-02-09 DIAGNOSIS — E669 Obesity, unspecified: Secondary | ICD-10-CM | POA: Insufficient documentation

## 2015-02-09 DIAGNOSIS — M25562 Pain in left knee: Secondary | ICD-10-CM | POA: Insufficient documentation

## 2015-02-09 DIAGNOSIS — E039 Hypothyroidism, unspecified: Secondary | ICD-10-CM | POA: Diagnosis not present

## 2015-02-09 MED ORDER — GABAPENTIN 300 MG PO CAPS: 300.0000 mg | ORAL_CAPSULE | Freq: Three times a day (TID) | ORAL | Status: DC

## 2015-02-09 NOTE — Progress Notes (Signed)
Safety precautions to be maintained throughout the outpatient stay will include: orient to surroundings, keep bed in low position, maintain call bell within reach at all times, provide assistance with transfer out of bed and ambulation.  

## 2015-02-09 NOTE — Progress Notes (Signed)
Subjective:    Patient ID: Frances Graves, female    DOB: 1951-02-22, 64 y.o.   MRN: [id number] Is patient is a pleasant delightful 64 year old lady who was initially seen by me in the [place] office of [facility] Her major complaint at that time was chronic bilateral knee pain Today her major pain is in the right knee and would like for this to be treated as soon as possible. She indicated that she's had chronic knee pain for several years and her pain has been associated with trauma She has been diagnosed with severe osteoarthritis of both knees. She has been treated in the past of cortisone injections and other injections but she has not obtained any lasting pain relief Most of these injections have only given her approximately 1 week's.relief. She has been treated by my associate Dr. Nyoka Lint and has been given gabapentin 300 mg 4 times a day and also hydrocodone 10/325 3 times a day Today her subjective pain intensity rating is 80%  CURRENT MEDICATIONS Current medications include gabapentin hydrocodone's 10/325 atenolol amlodipine and hydrochlorothiazide levothyroxine Bupropiion and Omeprazole. She also takes Human resources officer and docusate for constipation  ALLERGIES She is allergic to iodine mercury morphine and surgical tape  PAST MEDICAL HX Past medical history is positive for severe posterior arthritis of both knees severe genu valgum hypertension and stomach ulcer kidney disease pyelonephritis from age 64 hypothyroidism GERD, Hypothyroidism and chronic depression  PAST SURGICAL  HX Past surgical history is positive for a right total hip replacement in [place] indicates that she is not working and that she has been on disability for some time She does not use tobacco She does not use alcohol and she does not use any illicit drugs.  HPI    Review of Systems  Constitutional: Negative.  Negative for fever, chills, diaphoresis,  activity change, appetite change, fatigue and unexpected weight change.       This patient has severe obesity and her current weight is 300 pounds I explained to her that there is a direct correlation between her weight and the degenerative process taken place in both needs. The fact that she has returned suggests she is desirous of attempting to control the pain as a precursor in her losing weight  HENT: Negative for congestion, dental problem, drooling, ear discharge, ear pain, facial swelling, hearing loss, mouth sores, nosebleeds, postnasal drip, rhinorrhea and sinus pressure.   Eyes: Negative for photophobia, pain, discharge, redness, itching and visual disturbance.  Respiratory: Positive for chest tightness. Negative for apnea, cough, choking, shortness of breath and stridor.   Cardiovascular: Negative.  Negative for chest pain, palpitations and leg swelling.  Gastrointestinal: Negative.  Negative for abdominal distention.       She has a history of GERD  Endocrine: Negative.  Negative for cold intolerance, heat intolerance, polydipsia, polyphagia and polyuria.       She also has a history of hypothyroidism  Genitourinary: Negative.        She has a history of severe pyelonephritis from age 64 and continues to be afflicted with chronic renal disease  Allergic/Immunologic: Negative.   Neurological: Negative for dizziness, tremors, seizures, syncope, facial asymmetry, speech difficulty, weakness, light-headedness, numbness and headaches.  Hematological: Positive for adenopathy.  Psychiatric/Behavioral: Negative.  Negative for behavioral problems and agitation.       Objective:   Physical Exam  Constitutional: She is oriented to person, place, and time. She appears  well-developed and well-nourished. No distress.  This patient has severe obesity and has a current weight of 300 pounds  HENT:  Head: Normocephalic and atraumatic.  Right Ear: External ear normal.  Left Ear: External ear  normal.  Nose: Nose normal.  Mouth/Throat: Oropharynx is clear and moist. No oropharyngeal exudate.  Eyes: Conjunctivae are normal. Pupils are equal, round, and reactive to light. Right eye exhibits no discharge. Left eye exhibits no discharge. No scleral icterus.  Neck: Normal range of motion. Neck supple. No JVD present. No tracheal deviation present. No thyromegaly present.  Cardiovascular: Normal rate, regular rhythm, normal heart sounds and intact distal pulses.  Exam reveals no gallop and no friction rub.   No murmur heard. Pulmonary/Chest: Effort normal and breath sounds normal. No respiratory distress. She has no wheezes. She has no rales. She exhibits no tenderness.  Abdominal: Soft. Bowel sounds are normal. She exhibits no distension and no mass. There is no tenderness. There is no rebound and no guarding.  Musculoskeletal: She exhibits no edema or tenderness.  Range of motion is rather limited primarily because of the obesity and severe genu valgum  Lymphadenopathy:    She has no cervical adenopathy.  Neurological: She is alert and oriented to person, place, and time. She has normal reflexes. No cranial nerve deficit. She exhibits normal muscle tone. Coordination normal.  Skin: Skin is warm and dry. No rash noted. She is not diaphoretic. No erythema. No pallor.  Psychiatric: She has a normal mood and affect. Her behavior is normal. Judgment and thought content normal.   vital signs are relatively stable Blood pressure was 120/93 pulse was 69 bpm        Assessment & Plan:  1 bilateral knee pain 2 severe osteoarthritis of both knees 3 severe genu valgum 4  moderate obesity 5 depression 6 hypothyroidism  PLAN OF MANAGEMENT 1 for right femoral nerve block and a right sciatic nerve block 2  review order gabapentin 300 mg 3 times a day given 90 pills and 2 refills 3 if patient gets relief from the right side will proceed to repeat the same procedures on the left side 4 Will  plan to perform the procedure for this patient on Friday, July 8 5 [place] to bring a driver with her because her right leg may be temporarily nonfunctional.

## 2015-02-09 NOTE — Patient Instructions (Signed)
Do not eat or drink 6 hours before your appointment. You may have clear liquids and your morning meds until 2 hours before your appt. Bring someone reliable to drive you home.

## 2015-02-10 ENCOUNTER — Telehealth: Payer: Self-pay | Admitting: Anesthesiology

## 2015-02-10 NOTE — Telephone Encounter (Signed)
Pt called to cancel procedure sch for 02/11/15 . Pt also wants to talk to someone about meds she got on 02/09/15, she does not think he gave her enough pills to last a month.

## 2015-02-11 ENCOUNTER — Ambulatory Visit: Payer: BLUE CROSS/BLUE SHIELD | Admitting: Anesthesiology

## 2015-02-11 NOTE — Telephone Encounter (Signed)
Attempted to call patient to inform her of this, no answer.

## 2015-02-11 NOTE — Telephone Encounter (Signed)
Patient states she usually takes Gabapentin 300mg  qid, gets #120 tabs each month. Spoke with Dr. Floria Raveling, will write new prescription at next appt.

## 2015-02-14 NOTE — Telephone Encounter (Signed)
Attempted to call patient, phone number no valid. It rings, then stops.

## 2015-02-16 NOTE — Patient Instructions (Signed)
Lateral Femoral Cutaneous Nerve Block Patient Information  Description: The lateral femoral cutaneous nerve of the thigh is a purely sensory nerve that can become entrapped or irritated for a number of reasons.  The pain associated with this condition is called meralgia paraesthetica.  Patients affected with this syndrome have burning pain or abnormal sensation along the lateral aspect of the thigh.  The pain can be worsened by prolonged walking, standing, or constrictive garments around the house.   The diagnosis can be confirmed and treatment initiated by blocking the nerve with local anesthetic (like Novocaine).  At times, a steroid solution may be injected at the same time.  The site of injection is through a tiny needle in the left, lower quadrant of the abdomen.   The entire block usually lasts less than 5 minutes.  Conditions which may be treated by lateral femoral cutaneous nerve block:   Meralagia paraesthetica  Preparation for the injection:  1. Do not eat any solid food or dairy products within 6 hours of your appointment.  2. You may drink clear liquids up to 2 hours before appointment.  Clear liquids include water, black coffee, juice or soda. No milk or cream please. 3. You may take your regular medication, including pain medications, with a sip of water before your appointment.  Diabetics should hold regular insulin (if taken separately) and take 1/2 normal NPH dose the morning of the procedure.  Carry some sugar containing items with you to your appointment. 4. A driver must accompany you and be prepared to drive you home after your procedure. 5. Bring all you current medications with you 6. An IV may be inserted and sedation may be given at the discretion of the physician. 7. A blood pressure cuff, EKG and other monitors will often be applied during the procedure.  Some patients may need to have extra oxygen administered for a short period. 8. You will be asked to provide medical  information, including your allergies and medications, prior to the procedure.  We must know immediately if you are taking blood thinners (like Coumadin/Warfarin) or if you allergic to IV iodine contrast (dye)  We must know if you could possible be pregnant.   Possible side-effects:   Bleeding from needle site  Infection (rate, may require surgery)  Nerve injury (rare)  Numbness and Tingling (temporary)  Light-headedness (temporary)  Pain at injection site (several day)  Decreased blood pressure (rare, temporary)  Weakness in leg (temporary)  Call if you experience:  Hives or difficulty breathing (go to the emergency room)  Inflammation or drainage at the injection site(s)  Please note:  Although the local anesthetic injected can often make your leg feel good for several hours after the injection,  The pain may return.  It takes 3-7 days for steroids to work.  You may not notice any pain relief for at least one week.  If effective, we will often do a series of injections spaced 3-6 weeks apart to maximally decrease your pain.  If you have any questions, please cll [phone] [facility]   

## 2015-02-16 NOTE — Patient Instructions (Signed)
Lateral Femoral Cutaneous Nerve Block Patient Information  Description: The lateral femoral cutaneous nerve of the thigh is a purely sensory nerve that can become entrapped or irritated for a number of reasons.  The pain associated with this condition is called meralgia paraesthetica.  Patients affected with this syndrome have burning pain or abnormal sensation along the lateral aspect of the thigh.  The pain can be worsened by prolonged walking, standing, or constrictive garments around the house.   The diagnosis can be confirmed and treatment initiated by blocking the nerve with local anesthetic (like Novocaine).  At times, a steroid solution may be injected at the same time.  The site of injection is through a tiny needle in the left, lower quadrant of the abdomen.   The entire block usually lasts less than 5 minutes.  Conditions which may be treated by lateral femoral cutaneous nerve block:   Meralagia paraesthetica  Preparation for the injection:  1. Do not eat any solid food or dairy products within 6 hours of your appointment.  2. You may drink clear liquids up to 2 hours before appointment.  Clear liquids include water, black coffee, juice or soda. No milk or cream please. 3. You may take your regular medication, including pain medications, with a sip of water before your appointment.  Diabetics should hold regular insulin (if taken separately) and take 1/2 normal NPH dose the morning of the procedure.  Carry some sugar containing items with you to your appointment. 4. A driver must accompany you and be prepared to drive you home after your procedure. 5. Bring all you current medications with you 6. An IV may be inserted and sedation may be given at the discretion of the physician. 7. A blood pressure cuff, EKG and other monitors will often be applied during the procedure.  Some patients may need to have extra oxygen administered for a short period. 8. You will be asked to provide medical  information, including your allergies and medications, prior to the procedure.  We must know immediately if you are taking blood thinners (like Coumadin/Warfarin) or if you allergic to IV iodine contrast (dye)  We must know if you could possible be pregnant.   Possible side-effects:   Bleeding from needle site  Infection (rate, may require surgery)  Nerve injury (rare)  Numbness and Tingling (temporary)  Light-headedness (temporary)  Pain at injection site (several day)  Decreased blood pressure (rare, temporary)  Weakness in leg (temporary)  Call if you experience:  Hives or difficulty breathing (go to the emergency room)  Inflammation or drainage at the injection site(s)  Please note:  Although the local anesthetic injected can often make your leg feel good for several hours after the injection,  The pain may return.  It takes 3-7 days for steroids to work.  You may not notice any pain relief for at least one week.  If effective, we will often do a series of injections spaced 3-6 weeks apart to maximally decrease your pain.  If you have any questions, please cll [phone] [facility]   

## 2015-02-17 NOTE — Addendum Note (Signed)
Addended by: Morley Kos on: 02/17/2015 02:49 PM   Modules accepted: Level of Service

## 2015-02-24 ENCOUNTER — Other Ambulatory Visit: Payer: Self-pay | Admitting: Anesthesiology

## 2015-03-09 ENCOUNTER — Encounter: Payer: Self-pay | Admitting: Anesthesiology

## 2015-03-09 NOTE — Progress Notes (Signed)
   Subjective:    Patient ID: Frances Graves, female    DOB: 04-09-1951, 64 y.o.   MRN: [id number]  HPI    Review of Systems     Objective:   Physical Exam        Assessment & Plan:    Procedure note  Right lateral femoral cutaneous nerve of thigh block  Date of procedure 02/11/2059  Preoperative diagnosis  Primary osteoarthritis of left knee - Primary    ICD-9-CM: 715.16 ICD-10-CM: M17.12    Primary osteoarthritis of right knee     ICD-9-CM: 715.16 ICD-10-CM: M17.11    Severe obesity (BMI >= 40)     ICD-9-CM: 278.01 ICD-10-CM: E66.01    Major depressive disorder, single episode, moderate     ICD-9-CM: 296.22 ICD-10-CM: F32.1    Hypothyroidism, unspecified hypothyroidism type     ICD-9-CM: 244.9      Postoperative diagnosis Same  Procedure planned Right lateral femoral cutaneous nerve of thigh block  Surgeon Lance Bosch M.D  Anesthesia  Form consent was obtained and the patient appeared to accept and understand the benefits and risks of this procedure Timeout was done in conjunction with the nursing staff  Patient was taken to the procedure room and placed in the supine position the right hip was prepped with Betadine in the region of the right anterior superior iliac spine. After appropriate draping the right anterior superior iliac spine was identified and palpated After 0.1 inch inferior to the right anterior superior iliac spine and 1 inch medial to that spine, 22-gauge 3 inch needle was inserted and as the needle was advanced paresthesias were elicited going down the lateral aspect of the right thigh. Aspirations were negative for blood and other body fluids. Then 10 cc of 0.5% bupivacaine and 40 mg of Kenalog were injected in the region of the right lateral femoral cutaneous nerve of thigh. Patient tolerated procedure quite well The needle was removed and adequate hemostasis was obtained Vision was taken to the recovery  room in satisfactory condition where she was observed for about 45 minutes and then discharged home We will follow-up with patient in the next week   Lance Bosch M.D.

## 2015-05-17 ENCOUNTER — Other Ambulatory Visit: Payer: Self-pay | Admitting: Anesthesiology

## 2015-05-17 ENCOUNTER — Ambulatory Visit
Admission: RE | Admit: 2015-05-17 | Discharge: 2015-05-17 | Disposition: A | Discharge status: Home / Self Care | Payer: BLUE CROSS/BLUE SHIELD | Source: Ambulatory Visit | Attending: Anesthesiology | Admitting: Anesthesiology

## 2015-05-17 DIAGNOSIS — M545 Low back pain: Secondary | ICD-10-CM

## 2015-09-13 ENCOUNTER — Other Ambulatory Visit (HOSPITAL_COMMUNITY): Payer: Self-pay | Admitting: *Deleted

## 2015-09-13 DIAGNOSIS — Z79899 Other long term (current) drug therapy: Secondary | ICD-10-CM | POA: Diagnosis not present

## 2015-09-13 DIAGNOSIS — M545 Low back pain: Secondary | ICD-10-CM | POA: Diagnosis not present

## 2015-09-13 DIAGNOSIS — G894 Chronic pain syndrome: Secondary | ICD-10-CM | POA: Diagnosis not present

## 2015-09-13 DIAGNOSIS — M25569 Pain in unspecified knee: Secondary | ICD-10-CM | POA: Diagnosis not present

## 2015-09-13 DIAGNOSIS — M533 Sacrococcygeal disorders, not elsewhere classified: Secondary | ICD-10-CM | POA: Diagnosis not present

## 2015-09-14 ENCOUNTER — Ambulatory Visit (HOSPITAL_COMMUNITY)
Admission: RE | Admit: 2015-09-14 | Discharge: 2015-09-14 | Disposition: A | Discharge status: Home / Self Care | Payer: Medicare Other | Source: Ambulatory Visit | Attending: Nephrology | Admitting: Nephrology

## 2015-09-14 DIAGNOSIS — D509 Iron deficiency anemia, unspecified: Secondary | ICD-10-CM | POA: Insufficient documentation

## 2015-09-14 MED ORDER — SODIUM CHLORIDE 0.9 % IV SOLN
510.0000 mg | INTRAVENOUS | Status: DC
  Administered 2015-09-14: 510 mg via INTRAVENOUS
  Filled 2015-09-14: qty 17

## 2015-09-14 NOTE — Discharge Instructions (Signed)

## 2015-09-21 ENCOUNTER — Ambulatory Visit (HOSPITAL_COMMUNITY)
Admission: RE | Admit: 2015-09-21 | Discharge: 2015-09-21 | Disposition: A | Discharge status: Home / Self Care | Payer: Medicare Other | Source: Ambulatory Visit | Attending: Nephrology | Admitting: Nephrology

## 2015-09-21 DIAGNOSIS — D509 Iron deficiency anemia, unspecified: Secondary | ICD-10-CM | POA: Diagnosis not present

## 2015-09-21 MED ORDER — FERUMOXYTOL INJECTION 510 MG/17 ML
510.0000 mg | INTRAVENOUS | Status: DC
  Administered 2015-09-21: 510 mg via INTRAVENOUS
  Filled 2015-09-21: qty 17

## 2015-10-18 DIAGNOSIS — M25569 Pain in unspecified knee: Secondary | ICD-10-CM | POA: Diagnosis not present

## 2015-10-18 DIAGNOSIS — Z79899 Other long term (current) drug therapy: Secondary | ICD-10-CM | POA: Diagnosis not present

## 2015-10-18 DIAGNOSIS — M545 Low back pain: Secondary | ICD-10-CM | POA: Diagnosis not present

## 2015-10-18 DIAGNOSIS — M533 Sacrococcygeal disorders, not elsewhere classified: Secondary | ICD-10-CM | POA: Diagnosis not present

## 2015-10-18 DIAGNOSIS — G894 Chronic pain syndrome: Secondary | ICD-10-CM | POA: Diagnosis not present

## 2015-11-22 DIAGNOSIS — M25569 Pain in unspecified knee: Secondary | ICD-10-CM | POA: Diagnosis not present

## 2015-11-22 DIAGNOSIS — G894 Chronic pain syndrome: Secondary | ICD-10-CM | POA: Diagnosis not present

## 2015-11-22 DIAGNOSIS — Z79899 Other long term (current) drug therapy: Secondary | ICD-10-CM | POA: Diagnosis not present

## 2015-11-22 DIAGNOSIS — Z79891 Long term (current) use of opiate analgesic: Secondary | ICD-10-CM | POA: Diagnosis not present

## 2015-11-22 DIAGNOSIS — M545 Low back pain: Secondary | ICD-10-CM | POA: Diagnosis not present

## 2015-12-27 DIAGNOSIS — Z79891 Long term (current) use of opiate analgesic: Secondary | ICD-10-CM | POA: Diagnosis not present

## 2015-12-27 DIAGNOSIS — M25569 Pain in unspecified knee: Secondary | ICD-10-CM | POA: Diagnosis not present

## 2015-12-27 DIAGNOSIS — G894 Chronic pain syndrome: Secondary | ICD-10-CM | POA: Diagnosis not present

## 2015-12-27 DIAGNOSIS — M545 Low back pain: Secondary | ICD-10-CM | POA: Diagnosis not present

## 2015-12-27 DIAGNOSIS — Z79899 Other long term (current) drug therapy: Secondary | ICD-10-CM | POA: Diagnosis not present

## 2015-12-27 DIAGNOSIS — M533 Sacrococcygeal disorders, not elsewhere classified: Secondary | ICD-10-CM | POA: Diagnosis not present

## 2016-02-08 DIAGNOSIS — M533 Sacrococcygeal disorders, not elsewhere classified: Secondary | ICD-10-CM | POA: Diagnosis not present

## 2016-02-08 DIAGNOSIS — Z79899 Other long term (current) drug therapy: Secondary | ICD-10-CM | POA: Diagnosis not present

## 2016-02-08 DIAGNOSIS — M545 Low back pain: Secondary | ICD-10-CM | POA: Diagnosis not present

## 2016-02-08 DIAGNOSIS — G894 Chronic pain syndrome: Secondary | ICD-10-CM | POA: Diagnosis not present

## 2016-02-08 DIAGNOSIS — Z79891 Long term (current) use of opiate analgesic: Secondary | ICD-10-CM | POA: Diagnosis not present

## 2016-02-08 DIAGNOSIS — M25569 Pain in unspecified knee: Secondary | ICD-10-CM | POA: Diagnosis not present

## 2016-03-05 DIAGNOSIS — I129 Hypertensive chronic kidney disease with stage 1 through stage 4 chronic kidney disease, or unspecified chronic kidney disease: Secondary | ICD-10-CM | POA: Diagnosis not present

## 2016-03-05 DIAGNOSIS — K219 Gastro-esophageal reflux disease without esophagitis: Secondary | ICD-10-CM | POA: Diagnosis not present

## 2016-03-05 DIAGNOSIS — M1990 Unspecified osteoarthritis, unspecified site: Secondary | ICD-10-CM | POA: Diagnosis not present

## 2016-03-05 DIAGNOSIS — Z0000 Encounter for general adult medical examination without abnormal findings: Secondary | ICD-10-CM | POA: Diagnosis not present

## 2016-03-05 DIAGNOSIS — N189 Chronic kidney disease, unspecified: Secondary | ICD-10-CM | POA: Diagnosis not present

## 2016-03-05 DIAGNOSIS — F329 Major depressive disorder, single episode, unspecified: Secondary | ICD-10-CM | POA: Diagnosis not present

## 2016-03-05 DIAGNOSIS — E039 Hypothyroidism, unspecified: Secondary | ICD-10-CM | POA: Diagnosis not present

## 2016-03-22 DIAGNOSIS — E875 Hyperkalemia: Secondary | ICD-10-CM | POA: Diagnosis not present

## 2016-03-22 DIAGNOSIS — N179 Acute kidney failure, unspecified: Secondary | ICD-10-CM | POA: Diagnosis not present

## 2016-03-22 DIAGNOSIS — K219 Gastro-esophageal reflux disease without esophagitis: Secondary | ICD-10-CM | POA: Diagnosis not present

## 2016-03-22 DIAGNOSIS — I10 Essential (primary) hypertension: Secondary | ICD-10-CM | POA: Diagnosis not present

## 2016-03-22 DIAGNOSIS — D509 Iron deficiency anemia, unspecified: Secondary | ICD-10-CM | POA: Diagnosis not present

## 2016-03-22 DIAGNOSIS — M1990 Unspecified osteoarthritis, unspecified site: Secondary | ICD-10-CM | POA: Diagnosis not present

## 2016-03-22 DIAGNOSIS — N183 Chronic kidney disease, stage 3 (moderate): Secondary | ICD-10-CM | POA: Diagnosis not present

## 2016-03-22 DIAGNOSIS — N2581 Secondary hyperparathyroidism of renal origin: Secondary | ICD-10-CM | POA: Diagnosis not present

## 2016-03-22 DIAGNOSIS — I129 Hypertensive chronic kidney disease with stage 1 through stage 4 chronic kidney disease, or unspecified chronic kidney disease: Secondary | ICD-10-CM | POA: Diagnosis not present

## 2016-03-26 ENCOUNTER — Other Ambulatory Visit: Payer: Self-pay | Admitting: Nephrology

## 2016-03-26 DIAGNOSIS — N183 Chronic kidney disease, stage 3 (moderate): Principal | ICD-10-CM

## 2016-03-26 DIAGNOSIS — E1022 Type 1 diabetes mellitus with diabetic chronic kidney disease: Secondary | ICD-10-CM

## 2016-03-28 ENCOUNTER — Ambulatory Visit
Admission: RE | Admit: 2016-03-28 | Discharge: 2016-03-28 | Disposition: A | Discharge status: Home / Self Care | Payer: Medicare Other | Source: Ambulatory Visit | Attending: Nephrology | Admitting: Nephrology

## 2016-03-28 DIAGNOSIS — N183 Chronic kidney disease, stage 3 unspecified: Secondary | ICD-10-CM

## 2016-03-28 DIAGNOSIS — E1022 Type 1 diabetes mellitus with diabetic chronic kidney disease: Secondary | ICD-10-CM

## 2016-04-02 DIAGNOSIS — G894 Chronic pain syndrome: Secondary | ICD-10-CM | POA: Diagnosis not present

## 2016-04-02 DIAGNOSIS — M25569 Pain in unspecified knee: Secondary | ICD-10-CM | POA: Diagnosis not present

## 2016-04-02 DIAGNOSIS — Z79899 Other long term (current) drug therapy: Secondary | ICD-10-CM | POA: Diagnosis not present

## 2016-04-02 DIAGNOSIS — N183 Chronic kidney disease, stage 3 (moderate): Secondary | ICD-10-CM | POA: Diagnosis not present

## 2016-04-02 DIAGNOSIS — Z79891 Long term (current) use of opiate analgesic: Secondary | ICD-10-CM | POA: Diagnosis not present

## 2016-04-02 DIAGNOSIS — M545 Low back pain: Secondary | ICD-10-CM | POA: Diagnosis not present

## 2016-04-02 DIAGNOSIS — M533 Sacrococcygeal disorders, not elsewhere classified: Secondary | ICD-10-CM | POA: Diagnosis not present

## 2016-04-06 DIAGNOSIS — M170 Bilateral primary osteoarthritis of knee: Secondary | ICD-10-CM | POA: Diagnosis not present

## 2016-04-06 DIAGNOSIS — M1712 Unilateral primary osteoarthritis, left knee: Secondary | ICD-10-CM | POA: Diagnosis not present

## 2016-04-06 DIAGNOSIS — M1711 Unilateral primary osteoarthritis, right knee: Secondary | ICD-10-CM | POA: Diagnosis not present

## 2016-05-16 DIAGNOSIS — M533 Sacrococcygeal disorders, not elsewhere classified: Secondary | ICD-10-CM | POA: Diagnosis not present

## 2016-05-16 DIAGNOSIS — Z79899 Other long term (current) drug therapy: Secondary | ICD-10-CM | POA: Diagnosis not present

## 2016-05-16 DIAGNOSIS — Z79891 Long term (current) use of opiate analgesic: Secondary | ICD-10-CM | POA: Diagnosis not present

## 2016-05-16 DIAGNOSIS — M25569 Pain in unspecified knee: Secondary | ICD-10-CM | POA: Diagnosis not present

## 2016-05-16 DIAGNOSIS — M545 Low back pain: Secondary | ICD-10-CM | POA: Diagnosis not present

## 2016-05-16 DIAGNOSIS — G894 Chronic pain syndrome: Secondary | ICD-10-CM | POA: Diagnosis not present

## 2016-06-04 ENCOUNTER — Other Ambulatory Visit: Payer: Self-pay | Admitting: Family Medicine

## 2016-06-04 DIAGNOSIS — Z1231 Encounter for screening mammogram for malignant neoplasm of breast: Secondary | ICD-10-CM

## 2016-06-08 DIAGNOSIS — H353131 Nonexudative age-related macular degeneration, bilateral, early dry stage: Secondary | ICD-10-CM | POA: Diagnosis not present

## 2016-06-08 DIAGNOSIS — H25813 Combined forms of age-related cataract, bilateral: Secondary | ICD-10-CM | POA: Diagnosis not present

## 2016-06-21 ENCOUNTER — Ambulatory Visit
Admission: RE | Admit: 2016-06-21 | Discharge: 2016-06-21 | Disposition: A | Discharge status: Home / Self Care | Payer: Medicare Other | Source: Ambulatory Visit | Attending: Family Medicine | Admitting: Family Medicine

## 2016-06-21 DIAGNOSIS — Z1231 Encounter for screening mammogram for malignant neoplasm of breast: Secondary | ICD-10-CM

## 2016-06-27 DIAGNOSIS — M533 Sacrococcygeal disorders, not elsewhere classified: Secondary | ICD-10-CM | POA: Diagnosis not present

## 2016-06-27 DIAGNOSIS — M545 Low back pain: Secondary | ICD-10-CM | POA: Diagnosis not present

## 2016-06-27 DIAGNOSIS — M25569 Pain in unspecified knee: Secondary | ICD-10-CM | POA: Diagnosis not present

## 2016-06-27 DIAGNOSIS — G894 Chronic pain syndrome: Secondary | ICD-10-CM | POA: Diagnosis not present

## 2016-07-19 DIAGNOSIS — D509 Iron deficiency anemia, unspecified: Secondary | ICD-10-CM | POA: Diagnosis not present

## 2016-07-19 DIAGNOSIS — N179 Acute kidney failure, unspecified: Secondary | ICD-10-CM | POA: Diagnosis not present

## 2016-07-19 DIAGNOSIS — I10 Essential (primary) hypertension: Secondary | ICD-10-CM | POA: Diagnosis not present

## 2016-07-19 DIAGNOSIS — K219 Gastro-esophageal reflux disease without esophagitis: Secondary | ICD-10-CM | POA: Diagnosis not present

## 2016-07-19 DIAGNOSIS — N2581 Secondary hyperparathyroidism of renal origin: Secondary | ICD-10-CM | POA: Diagnosis not present

## 2016-07-19 DIAGNOSIS — M1990 Unspecified osteoarthritis, unspecified site: Secondary | ICD-10-CM | POA: Diagnosis not present

## 2016-07-19 DIAGNOSIS — E875 Hyperkalemia: Secondary | ICD-10-CM | POA: Diagnosis not present

## 2016-07-19 DIAGNOSIS — N183 Chronic kidney disease, stage 3 (moderate): Secondary | ICD-10-CM | POA: Diagnosis not present

## 2016-07-19 DIAGNOSIS — I129 Hypertensive chronic kidney disease with stage 1 through stage 4 chronic kidney disease, or unspecified chronic kidney disease: Secondary | ICD-10-CM | POA: Diagnosis not present

## 2016-07-23 DIAGNOSIS — H2512 Age-related nuclear cataract, left eye: Secondary | ICD-10-CM | POA: Diagnosis not present

## 2016-07-23 DIAGNOSIS — H25811 Combined forms of age-related cataract, right eye: Secondary | ICD-10-CM | POA: Diagnosis not present

## 2016-07-23 DIAGNOSIS — H25011 Cortical age-related cataract, right eye: Secondary | ICD-10-CM | POA: Diagnosis not present

## 2016-07-23 DIAGNOSIS — H2511 Age-related nuclear cataract, right eye: Secondary | ICD-10-CM | POA: Diagnosis not present

## 2016-07-31 DIAGNOSIS — M170 Bilateral primary osteoarthritis of knee: Secondary | ICD-10-CM | POA: Diagnosis not present

## 2016-08-08 DIAGNOSIS — Z79891 Long term (current) use of opiate analgesic: Secondary | ICD-10-CM | POA: Diagnosis not present

## 2016-08-08 DIAGNOSIS — M25569 Pain in unspecified knee: Secondary | ICD-10-CM | POA: Diagnosis not present

## 2016-08-08 DIAGNOSIS — Z79899 Other long term (current) drug therapy: Secondary | ICD-10-CM | POA: Diagnosis not present

## 2016-08-08 DIAGNOSIS — M533 Sacrococcygeal disorders, not elsewhere classified: Secondary | ICD-10-CM | POA: Diagnosis not present

## 2016-08-08 DIAGNOSIS — M545 Low back pain: Secondary | ICD-10-CM | POA: Diagnosis not present

## 2016-08-08 DIAGNOSIS — G894 Chronic pain syndrome: Secondary | ICD-10-CM | POA: Diagnosis not present

## 2016-08-29 DIAGNOSIS — H2512 Age-related nuclear cataract, left eye: Secondary | ICD-10-CM | POA: Diagnosis not present

## 2016-09-03 DIAGNOSIS — H25812 Combined forms of age-related cataract, left eye: Secondary | ICD-10-CM | POA: Diagnosis not present

## 2016-09-03 DIAGNOSIS — H25012 Cortical age-related cataract, left eye: Secondary | ICD-10-CM | POA: Diagnosis not present

## 2016-09-03 DIAGNOSIS — H2512 Age-related nuclear cataract, left eye: Secondary | ICD-10-CM | POA: Diagnosis not present

## 2016-09-12 DIAGNOSIS — E039 Hypothyroidism, unspecified: Secondary | ICD-10-CM | POA: Diagnosis not present

## 2016-09-12 DIAGNOSIS — N189 Chronic kidney disease, unspecified: Secondary | ICD-10-CM | POA: Diagnosis not present

## 2016-09-12 DIAGNOSIS — F329 Major depressive disorder, single episode, unspecified: Secondary | ICD-10-CM | POA: Diagnosis not present

## 2016-09-12 DIAGNOSIS — I129 Hypertensive chronic kidney disease with stage 1 through stage 4 chronic kidney disease, or unspecified chronic kidney disease: Secondary | ICD-10-CM | POA: Diagnosis not present

## 2016-09-19 DIAGNOSIS — M533 Sacrococcygeal disorders, not elsewhere classified: Secondary | ICD-10-CM | POA: Diagnosis not present

## 2016-09-19 DIAGNOSIS — M545 Low back pain: Secondary | ICD-10-CM | POA: Diagnosis not present

## 2016-09-19 DIAGNOSIS — Z79899 Other long term (current) drug therapy: Secondary | ICD-10-CM | POA: Diagnosis not present

## 2016-09-19 DIAGNOSIS — M25569 Pain in unspecified knee: Secondary | ICD-10-CM | POA: Diagnosis not present

## 2016-09-19 DIAGNOSIS — Z79891 Long term (current) use of opiate analgesic: Secondary | ICD-10-CM | POA: Diagnosis not present

## 2016-09-19 DIAGNOSIS — G894 Chronic pain syndrome: Secondary | ICD-10-CM | POA: Diagnosis not present

## 2016-09-24 DIAGNOSIS — D2239 Melanocytic nevi of other parts of face: Secondary | ICD-10-CM | POA: Diagnosis not present

## 2016-10-03 DIAGNOSIS — Z79899 Other long term (current) drug therapy: Secondary | ICD-10-CM | POA: Diagnosis not present

## 2016-10-03 DIAGNOSIS — G894 Chronic pain syndrome: Secondary | ICD-10-CM | POA: Diagnosis not present

## 2016-10-03 DIAGNOSIS — Z79891 Long term (current) use of opiate analgesic: Secondary | ICD-10-CM | POA: Diagnosis not present

## 2016-10-03 DIAGNOSIS — M545 Low back pain: Secondary | ICD-10-CM | POA: Diagnosis not present

## 2016-10-03 DIAGNOSIS — M533 Sacrococcygeal disorders, not elsewhere classified: Secondary | ICD-10-CM | POA: Diagnosis not present

## 2016-10-03 DIAGNOSIS — M25569 Pain in unspecified knee: Secondary | ICD-10-CM | POA: Diagnosis not present

## 2016-10-11 DIAGNOSIS — G8929 Other chronic pain: Secondary | ICD-10-CM | POA: Diagnosis not present

## 2016-10-25 DIAGNOSIS — I10 Essential (primary) hypertension: Secondary | ICD-10-CM | POA: Diagnosis not present

## 2016-10-25 DIAGNOSIS — I129 Hypertensive chronic kidney disease with stage 1 through stage 4 chronic kidney disease, or unspecified chronic kidney disease: Secondary | ICD-10-CM | POA: Diagnosis not present

## 2016-11-15 DIAGNOSIS — N183 Chronic kidney disease, stage 3 (moderate): Secondary | ICD-10-CM | POA: Diagnosis not present

## 2016-11-15 DIAGNOSIS — N179 Acute kidney failure, unspecified: Secondary | ICD-10-CM | POA: Diagnosis not present

## 2016-11-15 DIAGNOSIS — I129 Hypertensive chronic kidney disease with stage 1 through stage 4 chronic kidney disease, or unspecified chronic kidney disease: Secondary | ICD-10-CM | POA: Diagnosis not present

## 2016-11-15 DIAGNOSIS — I10 Essential (primary) hypertension: Secondary | ICD-10-CM | POA: Diagnosis not present

## 2017-03-21 DIAGNOSIS — M1711 Unilateral primary osteoarthritis, right knee: Secondary | ICD-10-CM | POA: Diagnosis not present

## 2017-03-21 DIAGNOSIS — G8929 Other chronic pain: Secondary | ICD-10-CM | POA: Diagnosis not present

## 2017-03-21 DIAGNOSIS — M25511 Pain in right shoulder: Secondary | ICD-10-CM | POA: Diagnosis not present

## 2017-03-21 DIAGNOSIS — M1712 Unilateral primary osteoarthritis, left knee: Secondary | ICD-10-CM | POA: Diagnosis not present

## 2017-04-04 DIAGNOSIS — M75101 Unspecified rotator cuff tear or rupture of right shoulder, not specified as traumatic: Secondary | ICD-10-CM | POA: Diagnosis not present

## 2017-04-04 DIAGNOSIS — M12811 Other specific arthropathies, not elsewhere classified, right shoulder: Secondary | ICD-10-CM | POA: Diagnosis not present

## 2017-04-04 DIAGNOSIS — M25511 Pain in right shoulder: Secondary | ICD-10-CM | POA: Diagnosis not present

## 2017-04-04 DIAGNOSIS — G8929 Other chronic pain: Secondary | ICD-10-CM | POA: Diagnosis not present

## 2017-05-27 DIAGNOSIS — D631 Anemia in chronic kidney disease: Secondary | ICD-10-CM | POA: Diagnosis not present

## 2017-05-27 DIAGNOSIS — N2581 Secondary hyperparathyroidism of renal origin: Secondary | ICD-10-CM | POA: Diagnosis not present

## 2017-05-27 DIAGNOSIS — N183 Chronic kidney disease, stage 3 (moderate): Secondary | ICD-10-CM | POA: Diagnosis not present

## 2017-05-30 DIAGNOSIS — N183 Chronic kidney disease, stage 3 (moderate): Secondary | ICD-10-CM | POA: Diagnosis not present

## 2017-05-30 DIAGNOSIS — N179 Acute kidney failure, unspecified: Secondary | ICD-10-CM | POA: Diagnosis not present

## 2017-05-30 DIAGNOSIS — I129 Hypertensive chronic kidney disease with stage 1 through stage 4 chronic kidney disease, or unspecified chronic kidney disease: Secondary | ICD-10-CM | POA: Diagnosis not present

## 2017-05-30 DIAGNOSIS — I10 Essential (primary) hypertension: Secondary | ICD-10-CM | POA: Diagnosis not present

## 2017-06-12 ENCOUNTER — Other Ambulatory Visit: Payer: Self-pay | Admitting: Family Medicine

## 2017-06-12 DIAGNOSIS — Z1231 Encounter for screening mammogram for malignant neoplasm of breast: Secondary | ICD-10-CM

## 2017-07-10 ENCOUNTER — Ambulatory Visit
Admission: RE | Admit: 2017-07-10 | Discharge: 2017-07-10 | Disposition: A | Discharge status: Home / Self Care | Payer: Medicare Other | Source: Ambulatory Visit | Attending: Family Medicine | Admitting: Family Medicine

## 2017-07-10 DIAGNOSIS — Z1231 Encounter for screening mammogram for malignant neoplasm of breast: Secondary | ICD-10-CM | POA: Diagnosis not present

## 2017-10-01 DIAGNOSIS — R69 Illness, unspecified: Secondary | ICD-10-CM | POA: Diagnosis not present

## 2017-10-16 DIAGNOSIS — D3132 Benign neoplasm of left choroid: Secondary | ICD-10-CM | POA: Diagnosis not present

## 2017-10-16 DIAGNOSIS — D3131 Benign neoplasm of right choroid: Secondary | ICD-10-CM | POA: Diagnosis not present

## 2017-10-16 DIAGNOSIS — Z961 Presence of intraocular lens: Secondary | ICD-10-CM | POA: Diagnosis not present

## 2017-10-16 DIAGNOSIS — H353131 Nonexudative age-related macular degeneration, bilateral, early dry stage: Secondary | ICD-10-CM | POA: Diagnosis not present

## 2017-11-01 DIAGNOSIS — N189 Chronic kidney disease, unspecified: Secondary | ICD-10-CM | POA: Diagnosis not present

## 2017-11-01 DIAGNOSIS — N2581 Secondary hyperparathyroidism of renal origin: Secondary | ICD-10-CM | POA: Diagnosis not present

## 2017-11-01 DIAGNOSIS — N183 Chronic kidney disease, stage 3 (moderate): Secondary | ICD-10-CM | POA: Diagnosis not present

## 2017-12-31 DIAGNOSIS — I129 Hypertensive chronic kidney disease with stage 1 through stage 4 chronic kidney disease, or unspecified chronic kidney disease: Secondary | ICD-10-CM | POA: Diagnosis not present

## 2017-12-31 DIAGNOSIS — N183 Chronic kidney disease, stage 3 (moderate): Secondary | ICD-10-CM | POA: Diagnosis not present

## 2017-12-31 DIAGNOSIS — N179 Acute kidney failure, unspecified: Secondary | ICD-10-CM | POA: Diagnosis not present

## 2017-12-31 DIAGNOSIS — D631 Anemia in chronic kidney disease: Secondary | ICD-10-CM | POA: Diagnosis not present

## 2018-01-08 DIAGNOSIS — N189 Chronic kidney disease, unspecified: Secondary | ICD-10-CM | POA: Diagnosis not present

## 2018-01-08 DIAGNOSIS — I129 Hypertensive chronic kidney disease with stage 1 through stage 4 chronic kidney disease, or unspecified chronic kidney disease: Secondary | ICD-10-CM | POA: Diagnosis not present

## 2018-01-08 DIAGNOSIS — N2581 Secondary hyperparathyroidism of renal origin: Secondary | ICD-10-CM | POA: Diagnosis not present

## 2018-01-08 DIAGNOSIS — N183 Chronic kidney disease, stage 3 (moderate): Secondary | ICD-10-CM | POA: Diagnosis not present

## 2018-03-14 DIAGNOSIS — I129 Hypertensive chronic kidney disease with stage 1 through stage 4 chronic kidney disease, or unspecified chronic kidney disease: Secondary | ICD-10-CM | POA: Diagnosis not present

## 2018-03-14 DIAGNOSIS — Z0000 Encounter for general adult medical examination without abnormal findings: Secondary | ICD-10-CM | POA: Diagnosis not present

## 2018-03-14 DIAGNOSIS — Z1159 Encounter for screening for other viral diseases: Secondary | ICD-10-CM | POA: Diagnosis not present

## 2018-03-14 DIAGNOSIS — M1990 Unspecified osteoarthritis, unspecified site: Secondary | ICD-10-CM | POA: Diagnosis not present

## 2018-03-14 DIAGNOSIS — N189 Chronic kidney disease, unspecified: Secondary | ICD-10-CM | POA: Diagnosis not present

## 2018-03-25 ENCOUNTER — Ambulatory Visit (INDEPENDENT_AMBULATORY_CARE_PROVIDER_SITE_OTHER): Payer: Medicare Other | Admitting: Podiatry

## 2018-03-25 ENCOUNTER — Encounter: Payer: Self-pay | Admitting: Podiatry

## 2018-03-25 DIAGNOSIS — L603 Nail dystrophy: Secondary | ICD-10-CM

## 2018-03-25 DIAGNOSIS — L608 Other nail disorders: Secondary | ICD-10-CM | POA: Diagnosis not present

## 2018-03-25 DIAGNOSIS — L601 Onycholysis: Secondary | ICD-10-CM | POA: Diagnosis not present

## 2018-03-26 NOTE — Progress Notes (Signed)
Subjective:  Patient ID: Frances Graves, female    DOB: [birth year],  MRN: [id number] HPI Chief Complaint  Patient presents with  . Nail Problem    Toenails - thick and darker than other nails, some soreness in the 1st right   . New Patient (Initial Visit)    67 y.o. female presents with the above complaint.   ROS: Denies fever chills nausea vomiting muscle aches pains calf pain back pain chest pain shortness of breath.  Past Medical History:  Diagnosis Date  . Arthritis   . Depression   . Hypertension   . Morbid obesity ([place])   . Nephritis 1955  . Thyroid disease    Past Surgical History:  Procedure Laterality Date  . JOINT REPLACEMENT Right 03-1998    Current Outpatient Medications:  .  Cannabinoids (FULL SPECTRUM EXTRACT PO), Take by mouth., Disp: , Rfl:  .  Ergocalciferol (VITAMIN D2 PO), Take by mouth., Disp: , Rfl:  .  Misc Natural Products (TART CHERRY ADVANCED PO), Take by mouth., Disp: , Rfl:  .  Multiple Vitamins-Minerals (VISION-VITE PRESERVE PO), Take by mouth., Disp: , Rfl:  .  acetaminophen (TYLENOL) 500 MG tablet, Take 2 tablets (1,000 mg total) by mouth 2 (two) times daily., Disp: 90 tablet, Rfl: 3 .  atenolol (TENORMIN) 50 MG tablet, Take 1 tablet (50 mg total) by mouth daily., Disp: 90 tablet, Rfl: 3 .  buPROPion (WELLBUTRIN SR) 150 MG 12 hr tablet, Take 150 mg by mouth 2 (two) times daily., Disp: , Rfl: 1 .  docusate sodium (COLACE) 100 MG capsule, Take 100 mg by mouth daily., Disp: , Rfl:  .  fexofenadine (ALLEGRA) 180 MG tablet, Take 180 mg by mouth daily., Disp: , Rfl:  .  furosemide (LASIX) 40 MG tablet, Take 0.5 tablets (20 mg total) by mouth daily. Take 0.5 tablet daily, Disp: 90 tablet, Rfl: 3 .  hydrALAZINE (APRESOLINE) 10 MG tablet, Take 10 mg by mouth 3 (three) times daily., Disp: , Rfl: 6 .  levothyroxine (SYNTHROID, LEVOTHROID) 50 MCG tablet, Take 1 tablet (50 mcg total) by mouth daily before breakfast., Disp: 90 tablet, Rfl: 3 .  omeprazole  (PRILOSEC) 20 MG capsule, Take 1 capsule (20 mg total) by mouth daily., Disp: 90 capsule, Rfl: 3  Allergies  Allergen Reactions  . Iodinated Diagnostic Agents   . Mercury   . Morphine And Related   . Tape    Review of Systems Objective:  There were no vitals filed for this visit.  General: Well developed, nourished, in no acute distress, alert and oriented x3   Dermatological: Skin is warm, dry and supple bilateral. Nails x 10 are well maintained; remaining integument appears unremarkable at this time. There are no open sores, no preulcerative lesions, no rash or signs of infection present.  Toenails are thick yellow dystrophic possibly mycotic hallux bilateral and lesser digits left.  No open lesions or wounds.   Vascular: Dorsalis Pedis artery and Posterior Tibial artery pedal pulses are 2/4 bilateral with immedate capillary fill time. Pedal hair growth present. No varicosities and no lower extremity edema present bilateral.   Neruologic: Grossly intact via light touch bilateral. Vibratory intact via tuning fork bilateral. Protective threshold with Semmes Wienstein monofilament intact to all pedal sites bilateral. Patellar and Achilles deep tendon reflexes 2+ bilateral. No Babinski or clonus noted bilateral.   Musculoskeletal: No gross boney pedal deformities bilateral. No pain, crepitus, or limitation noted with foot and ankle range of motion bilateral. Muscular strength 5/5  in all groups tested bilateral.  Gait: Unassisted, Nonantalgic.    Radiographs:  None taken  Assessment & Plan:   Assessment: Nail dystrophy hallux bilateral lesser digits left  Plan: Samples of the nails were taken today for pathologic evaluation.     Dmitri Pettigrew T. [place], [place]

## 2018-04-01 ENCOUNTER — Telehealth: Payer: Self-pay | Admitting: *Deleted

## 2018-04-01 NOTE — Telephone Encounter (Signed)
Left message to call for results

## 2018-04-01 NOTE — Telephone Encounter (Signed)
-----   Message from Garrel Ridgel, [place] sent at 04/01/2018  7:13 AM EDT ----- Call and let patient know that it is not fungus just a bad or dystrophic nail.

## 2018-04-02 ENCOUNTER — Telehealth: Payer: Self-pay | Admitting: *Deleted

## 2018-04-02 NOTE — Telephone Encounter (Signed)
-----   Message from Garrel Ridgel, [place] sent at 04/01/2018  7:13 AM EDT ----- Call and let patient know that it is not fungus just a bad or dystrophic nail.

## 2018-04-02 NOTE — Telephone Encounter (Signed)
Pt called for results. I left message informing pt Dr. Milinda Pointer had stated her test was negative and not treatment was needed.

## 2018-04-08 DIAGNOSIS — M810 Age-related osteoporosis without current pathological fracture: Secondary | ICD-10-CM | POA: Diagnosis not present

## 2018-04-08 DIAGNOSIS — M8588 Other specified disorders of bone density and structure, other site: Secondary | ICD-10-CM | POA: Diagnosis not present

## 2018-04-11 DIAGNOSIS — N2581 Secondary hyperparathyroidism of renal origin: Secondary | ICD-10-CM | POA: Diagnosis not present

## 2018-04-11 DIAGNOSIS — N183 Chronic kidney disease, stage 3 (moderate): Secondary | ICD-10-CM | POA: Diagnosis not present

## 2018-04-11 DIAGNOSIS — N189 Chronic kidney disease, unspecified: Secondary | ICD-10-CM | POA: Diagnosis not present

## 2018-04-22 ENCOUNTER — Telehealth: Payer: Self-pay | Admitting: *Deleted

## 2018-04-22 ENCOUNTER — Ambulatory Visit: Payer: Medicare Other | Admitting: Podiatry

## 2018-04-22 MED ORDER — NUVAIL EX SOLN: CUTANEOUS | 11 refills | Status: DC

## 2018-04-22 NOTE — Telephone Encounter (Signed)
I informed pt of Dr. Stephenie Acres review of results and orders. Pt states she would like to try the NuVail. I informed pt of [place], that they would contact her with insurance coverage and delivery information. Pt states understanding.

## 2018-04-22 NOTE — Telephone Encounter (Signed)
Dr. Milinda Pointer states if pt would like she can cancel today's appt and prescribe NuVail. Left message on pt's mobile and home phone, that Dr. Milinda Pointer had offered to begin her on an antifungal without being seen today if that was convenient for her.

## 2018-05-08 ENCOUNTER — Telehealth: Payer: Self-pay | Admitting: *Deleted

## 2018-05-08 NOTE — Telephone Encounter (Signed)
I called pt, I informed that the NuVail had been sent to [facility] and gave their NuVail Hotline [phone], and [phone].

## 2018-05-08 NOTE — Telephone Encounter (Signed)
Pt states she still has not gotten the ointment for the nails from [place].

## 2018-05-13 ENCOUNTER — Telehealth: Payer: Self-pay | Admitting: *Deleted

## 2018-05-13 MED ORDER — NUVAIL EX SOLN: CUTANEOUS | 11 refills | Status: DC

## 2018-05-13 NOTE — Telephone Encounter (Signed)
Pt states [employer] will not cover the Nuvail.

## 2018-05-13 NOTE — Telephone Encounter (Signed)
I informed pt, I had spoken with the NuVail representative and they had changed pre-certing pharmacy to [place], [place], [place] and the cost to the pt would be between $0 - $35.00/bottle and there would be free delivery. Pt agreed to try the new pharmacy.

## 2018-05-16 ENCOUNTER — Telehealth: Payer: Self-pay | Admitting: *Deleted

## 2018-05-16 NOTE — Telephone Encounter (Signed)
Pt states she was contacted by [facility] and they state NuVail would cost her $400.00, and they had tried to use a card.

## 2018-05-16 NOTE — Telephone Encounter (Signed)
I informed pt of conversation with [facility], and that once I received the cards I would mail one to her and change rx to [facility]. Pt states understanding.

## 2018-05-16 NOTE — Telephone Encounter (Signed)
I spoke with Lenna Sciara - [facility] states they no longer use specialty pharmacy, they use access cards to take pt's pharmacy then if covered by commercial insurance it would be 0 cost to pt. I told Melissa that we did not have access cards and she states she would FedEx cards to our office. I told Melissa that we have not received visit from NuVail representative since moving to our new office. Melissa states they no longer see other specialties than dermatology.

## 2018-05-23 MED ORDER — NUVAIL EX SOLN: CUTANEOUS | 12 refills | Status: DC

## 2018-05-23 NOTE — Telephone Encounter (Signed)
I spoke with Hinton Rao and she states she sent the discount cards regular mail, but it could be downloaded from [email or web address], tab on pt, pt saving card download.

## 2018-05-23 NOTE — Telephone Encounter (Addendum)
Left message informing pt I would be mailing a coupon to be used for 13 bottles (12 refills) of the NuVail. Mailed coupon to pt.

## 2018-05-23 NOTE — Telephone Encounter (Signed)
Pt states she has not heard anything from the pharmacy for the NuVail.

## 2018-05-23 NOTE — Addendum Note (Signed)
Addended by: Harriett Sine D on: 05/23/2018 04:01 PM   Modules accepted: Orders

## 2018-06-18 DIAGNOSIS — M5136 Other intervertebral disc degeneration, lumbar region: Secondary | ICD-10-CM | POA: Diagnosis not present

## 2018-06-18 DIAGNOSIS — M9902 Segmental and somatic dysfunction of thoracic region: Secondary | ICD-10-CM | POA: Diagnosis not present

## 2018-06-18 DIAGNOSIS — M5134 Other intervertebral disc degeneration, thoracic region: Secondary | ICD-10-CM | POA: Diagnosis not present

## 2018-06-18 DIAGNOSIS — M9903 Segmental and somatic dysfunction of lumbar region: Secondary | ICD-10-CM | POA: Diagnosis not present

## 2018-06-19 DIAGNOSIS — M9902 Segmental and somatic dysfunction of thoracic region: Secondary | ICD-10-CM | POA: Diagnosis not present

## 2018-06-19 DIAGNOSIS — M5136 Other intervertebral disc degeneration, lumbar region: Secondary | ICD-10-CM | POA: Diagnosis not present

## 2018-06-19 DIAGNOSIS — M9903 Segmental and somatic dysfunction of lumbar region: Secondary | ICD-10-CM | POA: Diagnosis not present

## 2018-06-19 DIAGNOSIS — M5134 Other intervertebral disc degeneration, thoracic region: Secondary | ICD-10-CM | POA: Diagnosis not present

## 2018-06-24 DIAGNOSIS — M9902 Segmental and somatic dysfunction of thoracic region: Secondary | ICD-10-CM | POA: Diagnosis not present

## 2018-06-24 DIAGNOSIS — M9903 Segmental and somatic dysfunction of lumbar region: Secondary | ICD-10-CM | POA: Diagnosis not present

## 2018-06-24 DIAGNOSIS — M5134 Other intervertebral disc degeneration, thoracic region: Secondary | ICD-10-CM | POA: Diagnosis not present

## 2018-06-24 DIAGNOSIS — M5136 Other intervertebral disc degeneration, lumbar region: Secondary | ICD-10-CM | POA: Diagnosis not present

## 2018-06-25 DIAGNOSIS — M5136 Other intervertebral disc degeneration, lumbar region: Secondary | ICD-10-CM | POA: Diagnosis not present

## 2018-06-25 DIAGNOSIS — M5134 Other intervertebral disc degeneration, thoracic region: Secondary | ICD-10-CM | POA: Diagnosis not present

## 2018-06-25 DIAGNOSIS — M9902 Segmental and somatic dysfunction of thoracic region: Secondary | ICD-10-CM | POA: Diagnosis not present

## 2018-06-25 DIAGNOSIS — M9903 Segmental and somatic dysfunction of lumbar region: Secondary | ICD-10-CM | POA: Diagnosis not present

## 2018-06-30 DIAGNOSIS — M9902 Segmental and somatic dysfunction of thoracic region: Secondary | ICD-10-CM | POA: Diagnosis not present

## 2018-06-30 DIAGNOSIS — M9903 Segmental and somatic dysfunction of lumbar region: Secondary | ICD-10-CM | POA: Diagnosis not present

## 2018-06-30 DIAGNOSIS — M5134 Other intervertebral disc degeneration, thoracic region: Secondary | ICD-10-CM | POA: Diagnosis not present

## 2018-06-30 DIAGNOSIS — M5136 Other intervertebral disc degeneration, lumbar region: Secondary | ICD-10-CM | POA: Diagnosis not present

## 2018-07-02 DIAGNOSIS — M5134 Other intervertebral disc degeneration, thoracic region: Secondary | ICD-10-CM | POA: Diagnosis not present

## 2018-07-02 DIAGNOSIS — M9902 Segmental and somatic dysfunction of thoracic region: Secondary | ICD-10-CM | POA: Diagnosis not present

## 2018-07-02 DIAGNOSIS — M9903 Segmental and somatic dysfunction of lumbar region: Secondary | ICD-10-CM | POA: Diagnosis not present

## 2018-07-02 DIAGNOSIS — M5136 Other intervertebral disc degeneration, lumbar region: Secondary | ICD-10-CM | POA: Diagnosis not present

## 2018-07-08 DIAGNOSIS — M9902 Segmental and somatic dysfunction of thoracic region: Secondary | ICD-10-CM | POA: Diagnosis not present

## 2018-07-08 DIAGNOSIS — M5136 Other intervertebral disc degeneration, lumbar region: Secondary | ICD-10-CM | POA: Diagnosis not present

## 2018-07-08 DIAGNOSIS — M5134 Other intervertebral disc degeneration, thoracic region: Secondary | ICD-10-CM | POA: Diagnosis not present

## 2018-07-08 DIAGNOSIS — M9903 Segmental and somatic dysfunction of lumbar region: Secondary | ICD-10-CM | POA: Diagnosis not present

## 2018-07-10 DIAGNOSIS — M9902 Segmental and somatic dysfunction of thoracic region: Secondary | ICD-10-CM | POA: Diagnosis not present

## 2018-07-10 DIAGNOSIS — M9903 Segmental and somatic dysfunction of lumbar region: Secondary | ICD-10-CM | POA: Diagnosis not present

## 2018-07-10 DIAGNOSIS — M5136 Other intervertebral disc degeneration, lumbar region: Secondary | ICD-10-CM | POA: Diagnosis not present

## 2018-07-10 DIAGNOSIS — M5134 Other intervertebral disc degeneration, thoracic region: Secondary | ICD-10-CM | POA: Diagnosis not present

## 2018-09-02 DIAGNOSIS — N183 Chronic kidney disease, stage 3 (moderate): Secondary | ICD-10-CM | POA: Diagnosis not present

## 2018-09-02 DIAGNOSIS — N189 Chronic kidney disease, unspecified: Secondary | ICD-10-CM | POA: Diagnosis not present

## 2018-09-09 DIAGNOSIS — I129 Hypertensive chronic kidney disease with stage 1 through stage 4 chronic kidney disease, or unspecified chronic kidney disease: Secondary | ICD-10-CM | POA: Diagnosis not present

## 2018-09-09 DIAGNOSIS — D631 Anemia in chronic kidney disease: Secondary | ICD-10-CM | POA: Diagnosis not present

## 2018-09-09 DIAGNOSIS — N183 Chronic kidney disease, stage 3 (moderate): Secondary | ICD-10-CM | POA: Diagnosis not present

## 2018-09-09 DIAGNOSIS — N179 Acute kidney failure, unspecified: Secondary | ICD-10-CM | POA: Diagnosis not present

## 2018-09-19 DIAGNOSIS — E875 Hyperkalemia: Secondary | ICD-10-CM | POA: Diagnosis not present

## 2018-09-22 DIAGNOSIS — E039 Hypothyroidism, unspecified: Secondary | ICD-10-CM | POA: Diagnosis not present

## 2018-09-30 ENCOUNTER — Other Ambulatory Visit: Payer: Self-pay | Admitting: Family Medicine

## 2018-09-30 DIAGNOSIS — Z1231 Encounter for screening mammogram for malignant neoplasm of breast: Secondary | ICD-10-CM

## 2018-10-16 DIAGNOSIS — Z961 Presence of intraocular lens: Secondary | ICD-10-CM | POA: Diagnosis not present

## 2018-10-16 DIAGNOSIS — D3131 Benign neoplasm of right choroid: Secondary | ICD-10-CM | POA: Diagnosis not present

## 2018-10-16 DIAGNOSIS — H353132 Nonexudative age-related macular degeneration, bilateral, intermediate dry stage: Secondary | ICD-10-CM | POA: Diagnosis not present

## 2018-10-16 DIAGNOSIS — D3132 Benign neoplasm of left choroid: Secondary | ICD-10-CM | POA: Diagnosis not present

## 2018-10-24 ENCOUNTER — Ambulatory Visit: Payer: Medicare Other

## 2018-11-28 ENCOUNTER — Ambulatory Visit: Payer: Medicare Other

## 2019-01-20 ENCOUNTER — Other Ambulatory Visit: Payer: Self-pay

## 2019-01-20 ENCOUNTER — Ambulatory Visit
Admission: RE | Admit: 2019-01-20 | Discharge: 2019-01-20 | Disposition: A | Discharge status: Home / Self Care | Payer: Medicare Other | Source: Ambulatory Visit | Attending: Family Medicine | Admitting: Family Medicine

## 2019-01-20 DIAGNOSIS — Z1231 Encounter for screening mammogram for malignant neoplasm of breast: Secondary | ICD-10-CM | POA: Diagnosis not present

## 2019-02-27 DIAGNOSIS — N183 Chronic kidney disease, stage 3 (moderate): Secondary | ICD-10-CM | POA: Diagnosis not present

## 2019-02-27 DIAGNOSIS — N189 Chronic kidney disease, unspecified: Secondary | ICD-10-CM | POA: Diagnosis not present

## 2019-03-17 DIAGNOSIS — Z0000 Encounter for general adult medical examination without abnormal findings: Secondary | ICD-10-CM | POA: Diagnosis not present

## 2019-03-17 DIAGNOSIS — N189 Chronic kidney disease, unspecified: Secondary | ICD-10-CM | POA: Diagnosis not present

## 2019-03-17 DIAGNOSIS — I129 Hypertensive chronic kidney disease with stage 1 through stage 4 chronic kidney disease, or unspecified chronic kidney disease: Secondary | ICD-10-CM | POA: Diagnosis not present

## 2019-03-17 DIAGNOSIS — Z1389 Encounter for screening for other disorder: Secondary | ICD-10-CM | POA: Diagnosis not present

## 2019-03-25 DIAGNOSIS — E039 Hypothyroidism, unspecified: Secondary | ICD-10-CM | POA: Diagnosis not present

## 2019-03-25 DIAGNOSIS — I129 Hypertensive chronic kidney disease with stage 1 through stage 4 chronic kidney disease, or unspecified chronic kidney disease: Secondary | ICD-10-CM | POA: Diagnosis not present

## 2019-04-29 DIAGNOSIS — I129 Hypertensive chronic kidney disease with stage 1 through stage 4 chronic kidney disease, or unspecified chronic kidney disease: Secondary | ICD-10-CM | POA: Diagnosis not present

## 2019-04-29 DIAGNOSIS — Z1159 Encounter for screening for other viral diseases: Secondary | ICD-10-CM | POA: Diagnosis not present

## 2019-04-29 DIAGNOSIS — N179 Acute kidney failure, unspecified: Secondary | ICD-10-CM | POA: Diagnosis not present

## 2019-04-29 DIAGNOSIS — N189 Chronic kidney disease, unspecified: Secondary | ICD-10-CM | POA: Diagnosis not present

## 2019-04-29 DIAGNOSIS — N2581 Secondary hyperparathyroidism of renal origin: Secondary | ICD-10-CM | POA: Diagnosis not present

## 2019-04-29 DIAGNOSIS — N183 Chronic kidney disease, stage 3 (moderate): Secondary | ICD-10-CM | POA: Diagnosis not present

## 2019-04-29 DIAGNOSIS — D631 Anemia in chronic kidney disease: Secondary | ICD-10-CM | POA: Diagnosis not present

## 2019-04-30 DIAGNOSIS — N183 Chronic kidney disease, stage 3 (moderate): Secondary | ICD-10-CM | POA: Diagnosis not present

## 2019-04-30 DIAGNOSIS — E875 Hyperkalemia: Secondary | ICD-10-CM | POA: Diagnosis not present

## 2019-08-11 DIAGNOSIS — N183 Chronic kidney disease, stage 3 unspecified: Secondary | ICD-10-CM | POA: Diagnosis not present

## 2019-08-11 DIAGNOSIS — N189 Chronic kidney disease, unspecified: Secondary | ICD-10-CM | POA: Diagnosis not present

## 2019-08-19 DIAGNOSIS — I129 Hypertensive chronic kidney disease with stage 1 through stage 4 chronic kidney disease, or unspecified chronic kidney disease: Secondary | ICD-10-CM | POA: Diagnosis not present

## 2019-08-19 DIAGNOSIS — N183 Chronic kidney disease, stage 3 unspecified: Secondary | ICD-10-CM | POA: Diagnosis not present

## 2019-08-19 DIAGNOSIS — N179 Acute kidney failure, unspecified: Secondary | ICD-10-CM | POA: Diagnosis not present

## 2019-08-19 DIAGNOSIS — D631 Anemia in chronic kidney disease: Secondary | ICD-10-CM | POA: Diagnosis not present

## 2019-09-04 DIAGNOSIS — I129 Hypertensive chronic kidney disease with stage 1 through stage 4 chronic kidney disease, or unspecified chronic kidney disease: Secondary | ICD-10-CM | POA: Diagnosis not present

## 2019-09-17 DIAGNOSIS — I129 Hypertensive chronic kidney disease with stage 1 through stage 4 chronic kidney disease, or unspecified chronic kidney disease: Secondary | ICD-10-CM | POA: Diagnosis not present

## 2019-09-17 DIAGNOSIS — N189 Chronic kidney disease, unspecified: Secondary | ICD-10-CM | POA: Diagnosis not present

## 2019-09-17 DIAGNOSIS — E039 Hypothyroidism, unspecified: Secondary | ICD-10-CM | POA: Diagnosis not present

## 2019-09-21 ENCOUNTER — Ambulatory Visit: Payer: Medicare Other | Attending: Internal Medicine

## 2019-09-21 ENCOUNTER — Other Ambulatory Visit: Payer: Self-pay

## 2019-09-21 DIAGNOSIS — Z23 Encounter for immunization: Secondary | ICD-10-CM | POA: Insufficient documentation

## 2019-09-21 NOTE — Progress Notes (Signed)
   Covid-19 Vaccination Clinic  Name:  Frances Graves    MRN: [id number] DOB: 05/04/1951  09/21/2019  [name] was observed post Covid-19 immunization for 15 minutes without incidence. She was provided with Vaccine Information Sheet and instruction to access the V-Safe system.   [name] was instructed to call 911 with any severe reactions post vaccine: Marland Kitchen Difficulty breathing  . Swelling of your face and throat  . A fast heartbeat  . A bad rash all over your body  . Dizziness and weakness    Immunizations Administered    Name Date Dose VIS Date Route   Moderna COVID-19 Vaccine 09/21/2019 12:18 PM 0.5 mL 07/07/2019 Intramuscular   Manufacturer: Moderna   Lot: [id number]   [place]:[id number]

## 2019-10-20 ENCOUNTER — Ambulatory Visit: Payer: Medicare Other | Attending: Internal Medicine

## 2019-10-20 ENCOUNTER — Other Ambulatory Visit: Payer: Self-pay

## 2019-10-20 DIAGNOSIS — D3132 Benign neoplasm of left choroid: Secondary | ICD-10-CM | POA: Diagnosis not present

## 2019-10-20 DIAGNOSIS — H353132 Nonexudative age-related macular degeneration, bilateral, intermediate dry stage: Secondary | ICD-10-CM | POA: Diagnosis not present

## 2019-10-20 DIAGNOSIS — Z23 Encounter for immunization: Secondary | ICD-10-CM

## 2019-10-20 DIAGNOSIS — D3131 Benign neoplasm of right choroid: Secondary | ICD-10-CM | POA: Diagnosis not present

## 2019-10-20 DIAGNOSIS — H47323 Drusen of optic disc, bilateral: Secondary | ICD-10-CM | POA: Diagnosis not present

## 2019-10-20 NOTE — Progress Notes (Signed)
   Covid-19 Vaccination Clinic  Name:  Frances Graves    MRN: [id number] DOB: [birth year]  10/20/2019  Frances Graves was observed post Covid-19 immunization for 15 minutes without incident. She was provided with Vaccine Information Sheet and instruction to access the V-Safe system.   Frances Graves was instructed to call 911 with any severe reactions post vaccine: Marland Kitchen Difficulty breathing  . Swelling of face and throat  . A fast heartbeat  . A bad rash all over body  . Dizziness and weakness   Immunizations Administered    Name Date Dose VIS Date Route   Moderna COVID-19 Vaccine 10/20/2019 12:16 PM 0.5 mL 07/07/2019 Intramuscular   Manufacturer: Moderna   Lot: [id number]   [id number]

## 2019-11-05 DIAGNOSIS — H353132 Nonexudative age-related macular degeneration, bilateral, intermediate dry stage: Secondary | ICD-10-CM | POA: Insufficient documentation

## 2019-11-05 DIAGNOSIS — D3132 Benign neoplasm of left choroid: Secondary | ICD-10-CM | POA: Insufficient documentation

## 2019-11-05 DIAGNOSIS — Z961 Presence of intraocular lens: Secondary | ICD-10-CM | POA: Insufficient documentation

## 2019-11-06 DIAGNOSIS — H353132 Nonexudative age-related macular degeneration, bilateral, intermediate dry stage: Secondary | ICD-10-CM | POA: Diagnosis not present

## 2019-11-06 DIAGNOSIS — D3132 Benign neoplasm of left choroid: Secondary | ICD-10-CM | POA: Diagnosis not present

## 2019-11-06 DIAGNOSIS — Z961 Presence of intraocular lens: Secondary | ICD-10-CM | POA: Diagnosis not present

## 2020-02-10 DIAGNOSIS — N183 Chronic kidney disease, stage 3 unspecified: Secondary | ICD-10-CM | POA: Diagnosis not present

## 2020-02-10 DIAGNOSIS — N189 Chronic kidney disease, unspecified: Secondary | ICD-10-CM | POA: Diagnosis not present

## 2020-02-15 DIAGNOSIS — D631 Anemia in chronic kidney disease: Secondary | ICD-10-CM | POA: Diagnosis not present

## 2020-02-15 DIAGNOSIS — N179 Acute kidney failure, unspecified: Secondary | ICD-10-CM | POA: Diagnosis not present

## 2020-02-15 DIAGNOSIS — N183 Chronic kidney disease, stage 3 unspecified: Secondary | ICD-10-CM | POA: Diagnosis not present

## 2020-02-15 DIAGNOSIS — I129 Hypertensive chronic kidney disease with stage 1 through stage 4 chronic kidney disease, or unspecified chronic kidney disease: Secondary | ICD-10-CM | POA: Diagnosis not present

## 2020-02-16 ENCOUNTER — Other Ambulatory Visit: Payer: Self-pay | Admitting: Family Medicine

## 2020-02-16 DIAGNOSIS — Z1231 Encounter for screening mammogram for malignant neoplasm of breast: Secondary | ICD-10-CM

## 2020-02-20 ENCOUNTER — Ambulatory Visit: Payer: Medicare Other

## 2020-02-20 ENCOUNTER — Ambulatory Visit: Payer: Medicare Other | Admitting: Podiatry

## 2020-02-20 ENCOUNTER — Encounter: Payer: Self-pay | Admitting: Podiatry

## 2020-02-20 ENCOUNTER — Other Ambulatory Visit: Payer: Self-pay

## 2020-02-20 DIAGNOSIS — M79676 Pain in unspecified toe(s): Secondary | ICD-10-CM | POA: Diagnosis not present

## 2020-02-20 DIAGNOSIS — M778 Other enthesopathies, not elsewhere classified: Secondary | ICD-10-CM

## 2020-02-20 DIAGNOSIS — B351 Tinea unguium: Secondary | ICD-10-CM | POA: Diagnosis not present

## 2020-02-21 NOTE — Progress Notes (Signed)
She presents today chief complaint of painful hallux right and third nail left.  States the toenails are thickened discolored she is unable to cut them herself so she would like for [place] to take care of it for her.  Objective: Vital signs are stable she is alert and oriented x3.  Pulses are palpable.  There is no erythema edema cellulitis drainage or odor.  She has thick yellow dystrophic nail hallux right and thickening nail with some discoloration third left.  There is no pain on palpation.  All of the toenails are long thick yellow and dystrophic to some degree.  Assessment: Pain in limb secondary to onychomycosis and nail dystrophy.  Plan: Debridement of toenails 1 through 5 bilaterally.  She will follow-up with Dr. Elisha Ponder

## 2020-03-02 ENCOUNTER — Other Ambulatory Visit: Payer: Self-pay

## 2020-03-02 ENCOUNTER — Ambulatory Visit
Admission: RE | Admit: 2020-03-02 | Discharge: 2020-03-02 | Disposition: A | Discharge status: Home / Self Care | Payer: Medicare Other | Source: Ambulatory Visit | Attending: Family Medicine | Admitting: Family Medicine

## 2020-03-02 DIAGNOSIS — Z1231 Encounter for screening mammogram for malignant neoplasm of breast: Secondary | ICD-10-CM | POA: Diagnosis not present

## 2020-03-04 DIAGNOSIS — N183 Chronic kidney disease, stage 3 unspecified: Secondary | ICD-10-CM | POA: Diagnosis not present

## 2020-03-31 ENCOUNTER — Other Ambulatory Visit: Payer: Self-pay | Admitting: Family Medicine

## 2020-03-31 DIAGNOSIS — E039 Hypothyroidism, unspecified: Secondary | ICD-10-CM | POA: Diagnosis not present

## 2020-03-31 DIAGNOSIS — Z0000 Encounter for general adult medical examination without abnormal findings: Secondary | ICD-10-CM | POA: Diagnosis not present

## 2020-03-31 DIAGNOSIS — M1990 Unspecified osteoarthritis, unspecified site: Secondary | ICD-10-CM | POA: Diagnosis not present

## 2020-03-31 DIAGNOSIS — I129 Hypertensive chronic kidney disease with stage 1 through stage 4 chronic kidney disease, or unspecified chronic kidney disease: Secondary | ICD-10-CM | POA: Diagnosis not present

## 2020-03-31 DIAGNOSIS — M810 Age-related osteoporosis without current pathological fracture: Secondary | ICD-10-CM

## 2020-05-11 DIAGNOSIS — Z20828 Contact with and (suspected) exposure to other viral communicable diseases: Secondary | ICD-10-CM | POA: Diagnosis not present

## 2020-05-25 ENCOUNTER — Other Ambulatory Visit: Payer: Self-pay

## 2020-05-25 ENCOUNTER — Encounter: Payer: Self-pay | Admitting: Podiatry

## 2020-05-25 ENCOUNTER — Ambulatory Visit: Payer: Medicare Other | Admitting: Podiatry

## 2020-05-25 DIAGNOSIS — M79676 Pain in unspecified toe(s): Secondary | ICD-10-CM | POA: Diagnosis not present

## 2020-05-25 DIAGNOSIS — B351 Tinea unguium: Secondary | ICD-10-CM | POA: Diagnosis not present

## 2020-05-25 NOTE — Patient Instructions (Signed)
EPSOM SALT FOOT SOAK INSTRUCTIONS  *IF YOU HAVE BEEN PRESCRIBED ANTIBIOTICS, TAKE AS INSTRUCTED UNTIL ALL ARE GONE*  Shopping List:  A. Plain epsom salt (not scented) B. Neosporin Cream/Ointment or Bacitracin Cream/Ointment (or prescribed antiobiotic drops/cream/ointment) C. 1-inch fabric band-aids  1.  Place 1/4 cup of epsom salts in 2 quarts of warm tap water. IF YOU ARE DIABETIC, OR HAVE NEUROPATHY, CHECK THE TEMPERATURE OF THE WATER WITH YOUR ELBOW.  2.  Submerge your foot/feet in the solution and soak for 10-15 minutes.      3.  Next, remove your foot/feet from solution, blot dry the affected area.    4.  Apply light amount of antibiotic cream/ointment and cover with fabric band-aid .  5.  This soak should be done once a day for 7 days.   6.  Monitor for any signs/symptoms of infection such as redness, swelling, odor, drainage, increased pain, or non-healing of digit.   7.  Please do not hesitate to call the office and speak to a Nurse or Doctor if you have questions.   8.  If you experience fever, chills, nightsweats, nausea or vomiting with worsening of digit, please go to the emergency room.   

## 2020-05-29 NOTE — Progress Notes (Signed)
Subjective:  Patient ID: Frances Graves, female    DOB: 1951-05-19,  MRN: [id number]  69 y.o. female presents with painful thick toenails that are difficult to trim. Pain interferes with ambulation. Aggravating factors include wearing enclosed shoe gear. Pain is relieved with periodic professional debridement..    Review of Systems: Negative except as noted in the HPI.  Past Medical History:  Diagnosis Date  . Arthritis   . Depression   . Hypertension   . Morbid obesity ([place])   . Nephritis 1955  . Thyroid disease    Past Surgical History:  Procedure Laterality Date  . JOINT REPLACEMENT Right 03-1998   Patient Active Problem List   Diagnosis Date Noted  . Intermediate stage nonexudative age-related macular degeneration of both eyes 11/05/2019  . Nevus of choroid of left eye 11/05/2019  . Pseudophakia of both eyes 11/05/2019  . Osteoarthritis of both knees 05/19/2013  . Morbid obesity ([place]) 05/19/2013  . Essential hypertension, benign 05/19/2013  . Major depression, chronic 05/19/2013  . Unspecified hypothyroidism 05/19/2013  . Annual physical exam 05/19/2013    Current Outpatient Medications:  .  acetaminophen (TYLENOL) 500 MG tablet, Take 2 tablets (1,000 mg total) by mouth 2 (two) times daily., Disp: 90 tablet, Rfl: 3 .  atenolol (TENORMIN) 50 MG tablet, Take 1 tablet (50 mg total) by mouth daily., Disp: 90 tablet, Rfl: 3 .  buPROPion (WELLBUTRIN SR) 150 MG 12 hr tablet, Take 150 mg by mouth 2 (two) times daily., Disp: , Rfl: 1 .  Cannabinoids (FULL SPECTRUM EXTRACT PO), Take by mouth., Disp: , Rfl:  .  docusate sodium (COLACE) 100 MG capsule, Take 100 mg by mouth daily., Disp: , Rfl:  .  Ergocalciferol (VITAMIN D2 PO), Take by mouth., Disp: , Rfl:  .  fexofenadine (ALLEGRA) 180 MG tablet, Take 180 mg by mouth daily., Disp: , Rfl:  .  furosemide (LASIX) 40 MG tablet, Take 0.5 tablets (20 mg total) by mouth daily. Take 0.5 tablet daily, Disp: 90 tablet, Rfl: 3 .  hydrALAZINE  (APRESOLINE) 10 MG tablet, Take 10 mg by mouth 3 (three) times daily., Disp: , Rfl: 6 .  levothyroxine (SYNTHROID, LEVOTHROID) 50 MCG tablet, Take 1 tablet (50 mcg total) by mouth daily before breakfast., Disp: 90 tablet, Rfl: 3 .  Multiple Vitamins-Minerals (VISION-VITE PRESERVE PO), Take by mouth., Disp: , Rfl:  .  omeprazole (PRILOSEC) 20 MG capsule, Take 1 capsule (20 mg total) by mouth daily., Disp: 90 capsule, Rfl: 3 .  Vitamin D, Ergocalciferol, (DRISDOL) 1.25 MG (50000 UNIT) CAPS capsule, Take by mouth., Disp: , Rfl:  Allergies  Allergen Reactions  . Other Anaphylaxis  . Iodinated Diagnostic Agents   . Mercury   . Morphine And Related   . Tape    Social History   Occupational History  . Not on file  Tobacco Use  . Smoking status: Never Smoker  . Smokeless tobacco: Never Used  Substance and Sexual Activity  . Alcohol use: Not on file  . Drug use: Not on file  . Sexual activity: Not on file    Objective:   Constitutional Pt is a pleasant 69 y.o. Caucasian female in NAD. AAO x 3.   Vascular Capillary refill time to digits immediate b/l. Palpable pedal pulses b/l LE. Pedal hair sparse. Lower extremity skin temperature gradient within normal limits. No cyanosis or clubbing noted.  Neurologic Normal speech. Protective sensation intact 5/5 intact bilaterally with 10g monofilament b/l.  Dermatologic Pedal skin with normal turgor,  texture and tone bilaterally. No open wounds bilaterally. No interdigital macerations bilaterally. Toenails 1-5 b/l elongated, discolored, dystrophic, thickened, crumbly with subungual debris and tenderness to dorsal palpation. Exuberant amount of fungal debris noted at proximal nail border with tenderness to palpation. Mild erythema. No purulence expressed from area.  Orthopedic: Normal muscle strength 5/5 to all lower extremity muscle groups bilaterally. No pain crepitus or joint limitation noted with ROM b/l. No gross bony deformities bilaterally.    Radiographs: None Assessment:   1. Pain due to onychomycosis of toenail    Plan:  Patient was evaluated and treated and all questions answered.  Onychomycosis with pain -Nails palliatively debridement as below. -Educated on self-care  Procedure: Nail Debridement Rationale: Pain Type of Debridement: manual, sharp debridement. Instrumentation: Nail nipper, rotary burr. Number of Nails: 10  -Examined patient. -Toenails 1-5 b/l were debrided in length and girth with sterile nail nippers and dremel without iatrogenic bleeding.  -Offending nail border debrided and curretaged R hallux utilizing sterile nail nipper and currette. Border(s) cleansed with alcohol and triple antibiotic ointment applied applied. Dispensed written instructions for once daily epsom salt soaks for 7 days. -Patient to report any pedal injuries to medical professional immediately. -Patient to continue soft, supportive shoe gear daily. -Patient/POA to call should there be question/concern in the interim.  Return in about 3 months (around 08/25/2020).  Marzetta Board, DPM

## 2020-06-14 DIAGNOSIS — N183 Chronic kidney disease, stage 3 unspecified: Secondary | ICD-10-CM | POA: Diagnosis not present

## 2020-06-23 DIAGNOSIS — I129 Hypertensive chronic kidney disease with stage 1 through stage 4 chronic kidney disease, or unspecified chronic kidney disease: Secondary | ICD-10-CM | POA: Diagnosis not present

## 2020-06-23 DIAGNOSIS — N179 Acute kidney failure, unspecified: Secondary | ICD-10-CM | POA: Diagnosis not present

## 2020-06-23 DIAGNOSIS — D631 Anemia in chronic kidney disease: Secondary | ICD-10-CM | POA: Diagnosis not present

## 2020-06-23 DIAGNOSIS — N183 Chronic kidney disease, stage 3 unspecified: Secondary | ICD-10-CM | POA: Diagnosis not present

## 2020-07-06 DIAGNOSIS — E559 Vitamin D deficiency, unspecified: Secondary | ICD-10-CM | POA: Diagnosis not present

## 2020-07-07 ENCOUNTER — Ambulatory Visit
Admission: RE | Admit: 2020-07-07 | Discharge: 2020-07-07 | Disposition: A | Discharge status: Home / Self Care | Payer: Medicare Other | Source: Ambulatory Visit | Attending: Family Medicine | Admitting: Family Medicine

## 2020-07-07 ENCOUNTER — Other Ambulatory Visit: Payer: Self-pay

## 2020-07-07 DIAGNOSIS — Z780 Asymptomatic menopausal state: Secondary | ICD-10-CM | POA: Diagnosis not present

## 2020-07-07 DIAGNOSIS — M810 Age-related osteoporosis without current pathological fracture: Secondary | ICD-10-CM

## 2020-07-07 DIAGNOSIS — M85852 Other specified disorders of bone density and structure, left thigh: Secondary | ICD-10-CM | POA: Diagnosis not present

## 2020-07-07 DIAGNOSIS — M85832 Other specified disorders of bone density and structure, left forearm: Secondary | ICD-10-CM | POA: Diagnosis not present

## 2020-08-19 DIAGNOSIS — N183 Chronic kidney disease, stage 3 unspecified: Secondary | ICD-10-CM | POA: Diagnosis not present

## 2020-09-02 ENCOUNTER — Other Ambulatory Visit: Payer: Self-pay

## 2020-09-02 ENCOUNTER — Ambulatory Visit: Payer: Medicare Other | Admitting: Podiatry

## 2020-09-02 ENCOUNTER — Encounter: Payer: Self-pay | Admitting: Podiatry

## 2020-09-02 DIAGNOSIS — M810 Age-related osteoporosis without current pathological fracture: Secondary | ICD-10-CM | POA: Insufficient documentation

## 2020-09-02 DIAGNOSIS — I129 Hypertensive chronic kidney disease with stage 1 through stage 4 chronic kidney disease, or unspecified chronic kidney disease: Secondary | ICD-10-CM | POA: Insufficient documentation

## 2020-09-02 DIAGNOSIS — M79676 Pain in unspecified toe(s): Secondary | ICD-10-CM | POA: Diagnosis not present

## 2020-09-02 DIAGNOSIS — B351 Tinea unguium: Secondary | ICD-10-CM

## 2020-09-02 DIAGNOSIS — K219 Gastro-esophageal reflux disease without esophagitis: Secondary | ICD-10-CM | POA: Insufficient documentation

## 2020-09-02 DIAGNOSIS — M1990 Unspecified osteoarthritis, unspecified site: Secondary | ICD-10-CM | POA: Insufficient documentation

## 2020-09-02 DIAGNOSIS — E559 Vitamin D deficiency, unspecified: Secondary | ICD-10-CM | POA: Insufficient documentation

## 2020-09-02 DIAGNOSIS — G8929 Other chronic pain: Secondary | ICD-10-CM | POA: Insufficient documentation

## 2020-09-04 NOTE — Progress Notes (Signed)
Subjective:  Patient ID: Frances Graves, female    DOB: 1951-03-07,  MRN: [id number]  70 y.o. female presents with painful thick toenails that are difficult to trim. Pain interferes with ambulation. Aggravating factors include wearing enclosed shoe gear. Pain is relieved with periodic professional debridement.   PCP is Dr. Kelton Pillar and last visit was 07/06/2020.  Review of Systems: Negative except as noted in the HPI.  Past Medical History:  Diagnosis Date  . Arthritis   . Depression   . Hypertension   . Morbid obesity ([place])   . Nephritis 1955  . Thyroid disease    Past Surgical History:  Procedure Laterality Date  . JOINT REPLACEMENT Right 03-1998   Patient Active Problem List   Diagnosis Date Noted  . Chronic kidney disease due to hypertension 09/02/2020  . Chronic pain 09/02/2020  . Gastroesophageal reflux disease 09/02/2020  . Osteoarthritis 09/02/2020  . Osteoporosis 09/02/2020  . Vitamin D deficiency 09/02/2020  . Intermediate stage nonexudative age-related macular degeneration of both eyes 11/05/2019  . Nevus of choroid of left eye 11/05/2019  . Pseudophakia of both eyes 11/05/2019  . Osteoarthritis of both knees 05/19/2013  . Morbid obesity ([place]) 05/19/2013  . Essential hypertension, benign 05/19/2013  . Major depression, chronic 05/19/2013  . Unspecified hypothyroidism 05/19/2013  . Annual physical exam 05/19/2013    Current Outpatient Medications:  .  acetaminophen (TYLENOL) 500 MG tablet, Take 2 tablets (1,000 mg total) by mouth 2 (two) times daily., Disp: 90 tablet, Rfl: 3 .  atenolol (TENORMIN) 50 MG tablet, Take 1 tablet (50 mg total) by mouth daily., Disp: 90 tablet, Rfl: 3 .  buPROPion (WELLBUTRIN SR) 150 MG 12 hr tablet, Take 150 mg by mouth 2 (two) times daily., Disp: , Rfl: 1 .  Cannabinoids (FULL SPECTRUM EXTRACT PO), Take by mouth., Disp: , Rfl:  .  docusate sodium (COLACE) 100 MG capsule, Take 100 mg by mouth daily., Disp: , Rfl:  .   Ergocalciferol (VITAMIN D2 PO), Take by mouth., Disp: , Rfl:  .  fexofenadine (ALLEGRA) 180 MG tablet, Take 180 mg by mouth daily., Disp: , Rfl:  .  furosemide (LASIX) 40 MG tablet, Take 0.5 tablets (20 mg total) by mouth daily. Take 0.5 tablet daily, Disp: 90 tablet, Rfl: 3 .  hydrALAZINE (APRESOLINE) 10 MG tablet, Take 10 mg by mouth 3 (three) times daily., Disp: , Rfl: 6 .  levothyroxine (SYNTHROID, LEVOTHROID) 50 MCG tablet, Take 1 tablet (50 mcg total) by mouth daily before breakfast., Disp: 90 tablet, Rfl: 3 .  Multiple Vitamins-Minerals (VISION-VITE PRESERVE PO), Take by mouth., Disp: , Rfl:  .  omeprazole (PRILOSEC) 20 MG capsule, Take 1 capsule (20 mg total) by mouth daily., Disp: 90 capsule, Rfl: 3 .  Vitamin D, Ergocalciferol, (DRISDOL) 1.25 MG (50000 UNIT) CAPS capsule, Take by mouth., Disp: , Rfl:  Allergies  Allergen Reactions  . Other Anaphylaxis  . Iodinated Diagnostic Agents   . Mercury   . Morphine And Related   . Tape    Social History   Occupational History  . Not on file  Tobacco Use  . Smoking status: Never Smoker  . Smokeless tobacco: Never Used  Substance and Sexual Activity  . Alcohol use: Not on file  . Drug use: Not on file  . Sexual activity: Not on file    Objective:   Constitutional Pt is a pleasant 70 y.o. Caucasian female in NAD. AAO x 3.   Vascular Capillary refill time to digits  immediate b/l. Palpable pedal pulses b/l LE. Pedal hair sparse. Lower extremity skin temperature gradient within normal limits. No cyanosis or clubbing noted.  Neurologic Normal speech. Protective sensation intact 5/5 intact bilaterally with 10g monofilament b/l.  Dermatologic Pedal skin with normal turgor, texture and tone bilaterally. No open wounds bilaterally. No interdigital macerations bilaterally. Toenails 1-5 b/l elongated, discolored, dystrophic, thickened, crumbly with subungual debris and tenderness to dorsal palpation.   Orthopedic: Normal muscle strength 5/5 to  all lower extremity muscle groups bilaterally. No pain crepitus or joint limitation noted with ROM b/l. No gross bony deformities bilaterally.   Radiographs: None Assessment:   1. Pain due to onychomycosis of toenail    Plan:  Patient was evaluated and treated and all questions answered.  Onychomycosis with pain -Nails palliatively debridement as below. -Educated on self-care  Procedure: Nail Debridement Rationale: Pain Type of Debridement: manual, sharp debridement. Instrumentation: Nail nipper, rotary burr. Number of Nails: 10  -Examined patient. -Patient to continue soft, supportive shoe gear daily. -Toenails 1-5 b/l were debrided in length and girth with sterile nail nippers and dremel without iatrogenic bleeding.  -Patient to report any pedal injuries to medical professional immediately. -Patient/POA to call should there be question/concern in the interim.  Return in about 3 months (around 12/01/2020).  Marzetta Board, DPM

## 2020-10-04 DIAGNOSIS — J029 Acute pharyngitis, unspecified: Secondary | ICD-10-CM | POA: Diagnosis not present

## 2020-10-04 DIAGNOSIS — R059 Cough, unspecified: Secondary | ICD-10-CM | POA: Diagnosis not present

## 2020-10-04 DIAGNOSIS — R0981 Nasal congestion: Secondary | ICD-10-CM | POA: Diagnosis not present

## 2020-10-04 DIAGNOSIS — B349 Viral infection, unspecified: Secondary | ICD-10-CM | POA: Diagnosis not present

## 2020-12-08 DIAGNOSIS — D3131 Benign neoplasm of right choroid: Secondary | ICD-10-CM | POA: Diagnosis not present

## 2020-12-08 DIAGNOSIS — Z961 Presence of intraocular lens: Secondary | ICD-10-CM | POA: Diagnosis not present

## 2020-12-08 DIAGNOSIS — H353132 Nonexudative age-related macular degeneration, bilateral, intermediate dry stage: Secondary | ICD-10-CM | POA: Diagnosis not present

## 2020-12-08 DIAGNOSIS — D3132 Benign neoplasm of left choroid: Secondary | ICD-10-CM | POA: Diagnosis not present

## 2020-12-16 ENCOUNTER — Encounter: Payer: Self-pay | Admitting: Podiatry

## 2020-12-16 ENCOUNTER — Ambulatory Visit: Payer: Medicare Other | Admitting: Podiatry

## 2020-12-16 ENCOUNTER — Other Ambulatory Visit: Payer: Self-pay

## 2020-12-16 DIAGNOSIS — B351 Tinea unguium: Secondary | ICD-10-CM

## 2020-12-16 DIAGNOSIS — M79676 Pain in unspecified toe(s): Secondary | ICD-10-CM | POA: Diagnosis not present

## 2020-12-23 NOTE — Progress Notes (Signed)
  Subjective:  Patient ID: Frances Graves, female    DOB: [birth year],  MRN: [id number]  70 y.o. female presents painful thick toenails that are difficult to trim. Pain interferes with ambulation. Aggravating factors include wearing enclosed shoe gear. Pain is relieved with periodic professional debridement..    Allergies  Allergen Reactions  . Other Anaphylaxis    Other reaction(s): Unknown  . Ammoniated Mercury     Other reaction(s): Unknown  . Iodinated Diagnostic Agents   . Lisinopril     Other reaction(s): Unknown  . Mercury   . Morphine And Related     Other reaction(s): Unknown  . Tape     Review of Systems: Negative except as noted in the HPI.   Objective:   Constitutional Pt is a pleasant 70 y.o. Caucasian female morbidly obese in NAD. AAO x 3.   Vascular Capillary refill time to digits immediate b/l. Palpable DP pulse(s) b/l lower extremities Palpable PT pulse(s) b/l lower extremities Pedal hair sparse. Lower extremity skin temperature gradient within normal limits.  Neurologic Protective sensation intact 5/5 intact bilaterally with 10g monofilament b/l. Vibratory sensation intact b/l. Proprioception intact bilaterally.  Dermatologic Pedal skin with normal turgor, texture and tone bilaterally. No open wounds bilaterally. No interdigital macerations bilaterally. Toenails 1-5 b/l elongated, discolored, dystrophic, thickened, crumbly with subungual debris and tenderness to dorsal palpation.  Orthopedic: Normal muscle strength 5/5 to all lower extremity muscle groups bilaterally. No pain crepitus or joint limitation noted with ROM b/l. No gross bony deformities bilaterally.   Radiographs: None Assessment:   1. Pain due to onychomycosis of toenail     Plan:  Patient was evaluated and treated and all questions answered.  Onychomycosis with pain -Nails palliatively debridement as below. -Educated on self-care  Procedure: Nail Debridement Rationale: Pain Type of  Debridement: manual, sharp debridement. Instrumentation: Nail nipper, rotary burr. Number of Nails: 10  -Examined patient. -No new findings. No new orders. -Patient to continue soft, supportive shoe gear daily. -Toenails 1-5 b/l were debrided in length and girth with sterile nail nippers and dremel without iatrogenic bleeding.  -Patient to report any pedal injuries to medical professional immediately. -Patient/POA to call should there be question/concern in the interim.  Return in about 3 months (around 03/18/2021).  Marzetta Board, DPM

## 2021-03-16 DIAGNOSIS — Z961 Presence of intraocular lens: Secondary | ICD-10-CM | POA: Diagnosis not present

## 2021-03-16 DIAGNOSIS — D3132 Benign neoplasm of left choroid: Secondary | ICD-10-CM | POA: Diagnosis not present

## 2021-03-16 DIAGNOSIS — H353132 Nonexudative age-related macular degeneration, bilateral, intermediate dry stage: Secondary | ICD-10-CM | POA: Diagnosis not present

## 2021-03-20 ENCOUNTER — Ambulatory Visit: Payer: Medicare Other | Admitting: Podiatry

## 2021-03-22 ENCOUNTER — Other Ambulatory Visit: Payer: Self-pay

## 2021-03-22 ENCOUNTER — Ambulatory Visit: Payer: Medicare Other | Admitting: Podiatry

## 2021-03-22 ENCOUNTER — Encounter: Payer: Self-pay | Admitting: Podiatry

## 2021-03-22 DIAGNOSIS — B351 Tinea unguium: Secondary | ICD-10-CM

## 2021-03-22 DIAGNOSIS — R21 Rash and other nonspecific skin eruption: Secondary | ICD-10-CM

## 2021-03-22 DIAGNOSIS — M79676 Pain in unspecified toe(s): Secondary | ICD-10-CM

## 2021-03-22 MED ORDER — TRIAMCINOLONE ACETONIDE 0.1 % EX CREA: TOPICAL_CREAM | CUTANEOUS | 0 refills | Status: DC

## 2021-03-22 NOTE — Patient Instructions (Signed)
Rash, Adult  A rash is a change in the color of your skin. A rash can also change the way your skin feels. There are many different conditions and factors that can causea rash. Follow these instructions at home: The goal of treatment is to stop the itching and keep the rash from spreading. Watch for any changes in your symptoms. Let your doctor know about them. Followthese instructions to help with your condition: Medicine Take or apply over-the-counter and prescription medicines only as told by your doctor. These may include medicines: To treat red or swollen skin (corticosteroid creams). To treat itching. To treat an allergy (oral antihistamines). To treat very bad symptoms (oral corticosteroids).  Skin care Put cool cloths (compresses) on the affected areas. Do not scratch or rub your skin. Avoid covering the rash. Make sure that the rash is exposed to air as much as possible. Managing itching and discomfort Avoid hot showers or baths. These can make itching worse. A cold shower may help. Try taking a bath with: Epsom salts. You can get these at your local pharmacy or grocery store. Follow the instructions on the package. Baking soda. Pour a small amount into the bath as told by your doctor. Colloidal oatmeal. You can get this at your local pharmacy or grocery store. Follow the instructions on the package. Try putting baking soda paste onto your skin. Stir water into baking soda until it gets like a paste. Try putting on a lotion that relieves itchiness (calamine lotion). Keep cool and out of the sun. Sweating and being hot can make itching worse. General instructions  Rest as needed. Drink enough fluid to keep your pee (urine) pale yellow. Wear loose-fitting clothing. Avoid scented soaps, detergents, and perfumes. Use gentle soaps, detergents, perfumes, and other cosmetic products. Avoid anything that causes your rash. Keep a journal to help track what causes your rash. Write  down: What you eat. What cosmetic products you use. What you drink. What you wear. This includes jewelry. Keep all follow-up visits as told by your doctor. This is important.  Contact a doctor if: You sweat at night. You lose weight. You pee (urinate) more than normal. You pee less than normal, or you notice that your pee is a darker color than normal. You feel weak. You throw up (vomit). Your skin or the whites of your eyes look yellow (jaundice). Your skin: Tingles. Is numb. Your rash: Does not go away after a few days. Gets worse. You are: More thirsty than normal. More tired than normal. You have: New symptoms. Pain in your belly (abdomen). A fever. Watery poop (diarrhea). Get help right away if: You have a fever and your symptoms suddenly get worse. You start to feel mixed up (confused). You have a very bad headache or a stiff neck. You have very bad joint pains or stiffness. You have jerky movements that you cannot control (seizure). Your rash covers all or most of your body. The rash may or may not be painful. You have blisters that: Are on top of the rash. Grow larger. Grow together. Are painful. Are inside your nose or mouth. You have a rash that: Looks like purple pinprick-sized spots all over your body. Has a "bull's eye" or looks like a target. Is red and painful, causes your skin to peel, and is not from being in the sun too long. Summary A rash is a change in the color of your skin. A rash can also change the way your skin feels.   The goal of treatment is to stop the itching and keep the rash from spreading. Take or apply over-the-counter and prescription medicines only as told by your doctor. Contact a doctor if you have new symptoms or symptoms that get worse. Keep all follow-up visits as told by your doctor. This is important. This information is not intended to replace advice given to you by your health care provider. Make sure you discuss any  questions you have with your healthcare provider. Document Revised: 11/14/2018 Document Reviewed: 02/24/2018 Elsevier Patient Education  2022 [employer]  

## 2021-03-26 NOTE — Progress Notes (Signed)
Subjective: Frances Graves is a pleasant 70 y.o. female patient seen today for painful thick toenails that are difficult to trim. Pain interferes with ambulation. Aggravating factors include wearing enclosed shoe gear. Pain is relieved with periodic professional debridement.  She relates new problem of skin rash on her right foot and right 2nd toes which appeared one month ago. Denies any weeping or blistering. No drainage.  PCP is Kelton Pillar, MD. Last visit was: one year ago.  Allergies  Allergen Reactions   Other Anaphylaxis    Other reaction(s): Unknown   Ammoniated Mercury     Other reaction(s): Unknown   Iodinated Diagnostic Agents    Lisinopril     Other reaction(s): Unknown   Mercury    Morphine And Related     Other reaction(s): Unknown   Tape     Objective: Physical Exam  General: Frances Graves is a pleasant 70 y.o. Caucasian female, morbidly obese in NAD. AAO x 3.   Vascular:  Capillary refill time to digits immediate b/l. Palpable DP pulse(s) b/l lower extremities Palpable PT pulse(s) b/l lower extremities Pedal hair sparse. Lower extremity skin temperature gradient within normal limits.  Dermatological:  Skin warm and supple b/l lower extremities. No open wounds b/l lower extremities. No interdigital macerations b/l lower extremities. Toenails 1-5 b/l elongated, discolored, dystrophic, thickened, crumbly with subungual debris and tenderness to dorsal palpation. Skin eruption noted along dorsal aspect of bilateral 2nd digits. Also has nickel sized skin eruption on dorsal aspect of right foot. No warmth, no erythema, no blisters, no pain on palpation.  Musculoskeletal:  Normal muscle strength 5/5 to all lower extremity muscle groups bilaterally. No pain crepitus or joint limitation noted with ROM b/l lower extremities. No gross bony deformities b/l lower extremities.  Neurological:  Protective sensation intact 5/5 intact bilaterally with 10g monofilament b/l.  Vibratory sensation intact b/l. Proprioception intact bilaterally.  Assessment and Plan:  1. Pain due to onychomycosis of toenail   2. Rash of foot   -Examined patient. -Patient to continue soft, supportive shoe gear daily. -Toenails 1-5 b/l were debrided in length and girth with sterile nail nippers and dremel without iatrogenic bleeding.  -Patient to report any pedal injuries to medical professional immediately. -Rx sent to pharmacy for triamcinolone cream 0.1% to be applied to rashes of both feet twice daily until resolved. -Patient/POA to call should there be question/concern in the interim.  Return in about 3 months (around 06/22/2021).  Marzetta Board, DPM

## 2021-04-05 IMAGING — MG DIGITAL SCREENING BILATERAL MAMMOGRAM WITH CAD
4 series · 4 of 4 positions shown · non-contrast
Comparison: Previous exam(s).

ACR Breast Density Category a: The breast tissue is almost entirely
fatty.

CLINICAL DATA: Screening.

EXAM:
DIGITAL SCREENING BILATERAL MAMMOGRAM WITH CAD

[R MLO]
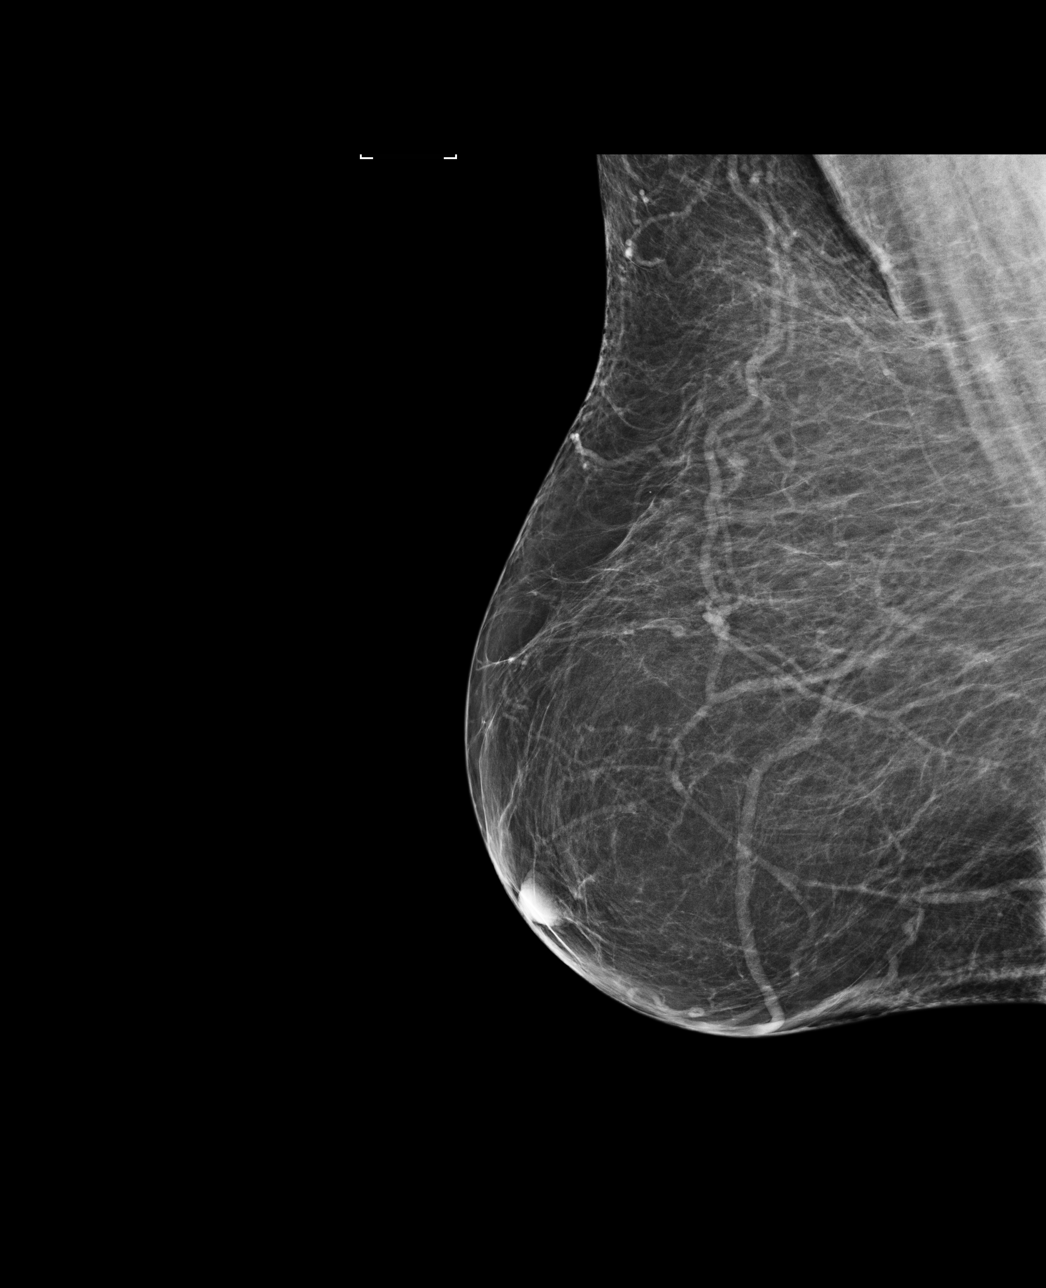

[L CC]
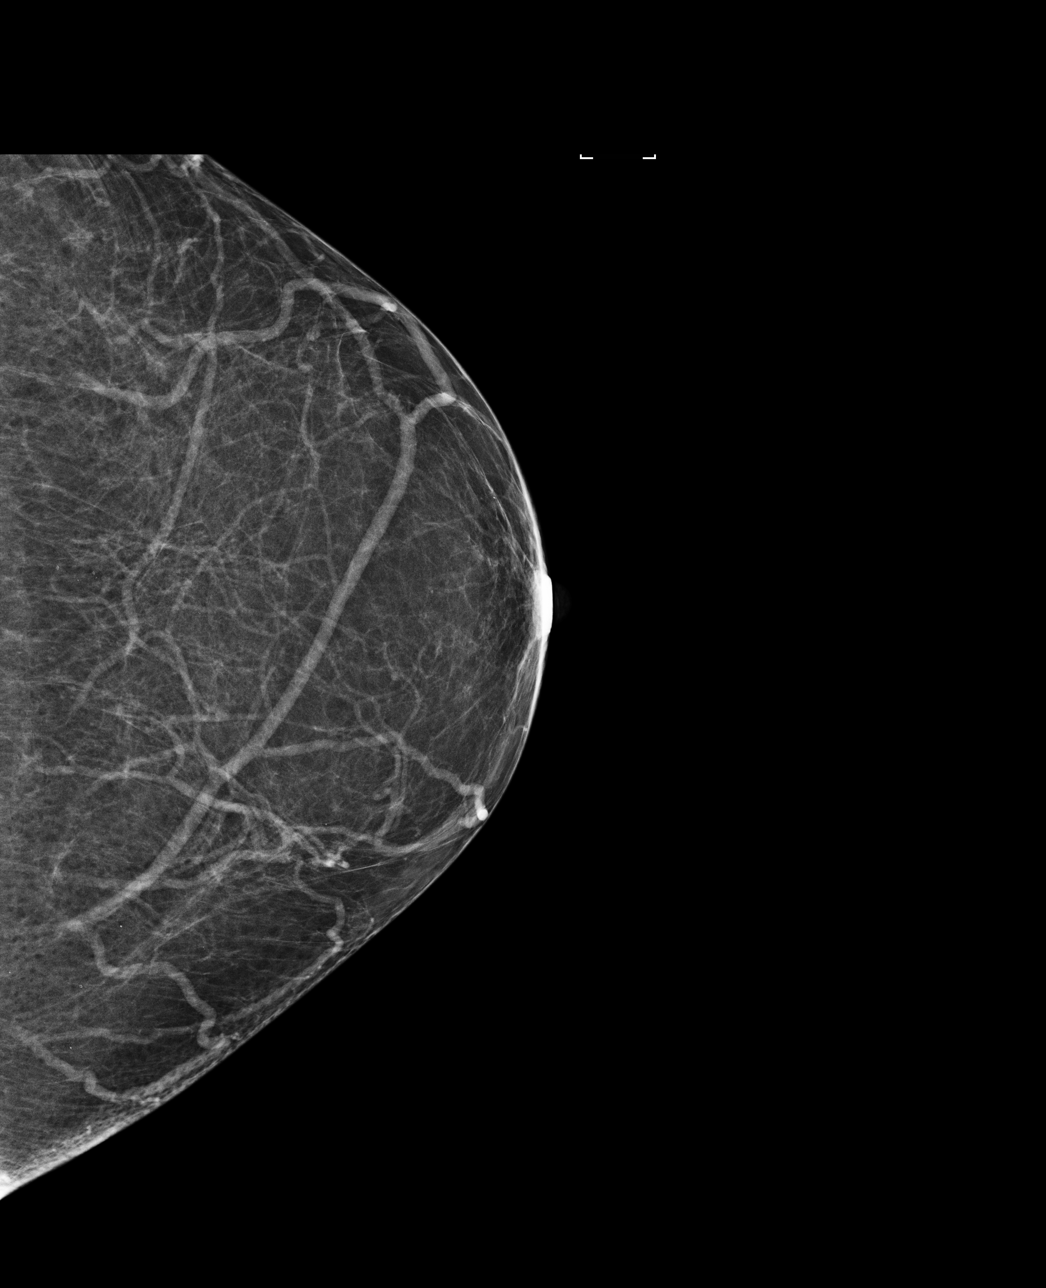

[R CC]
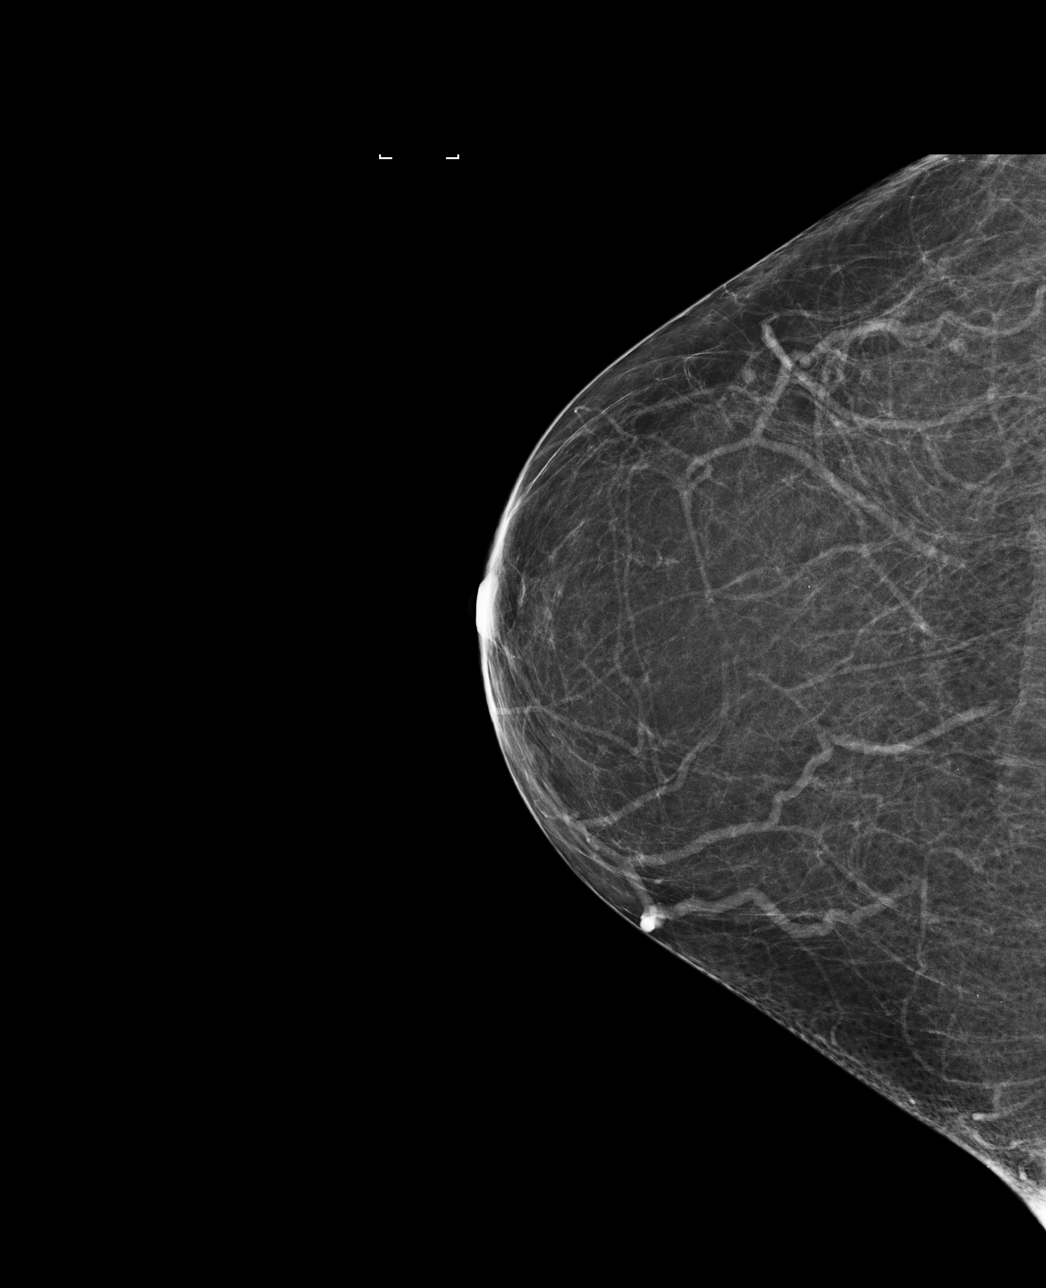

[L MLO]
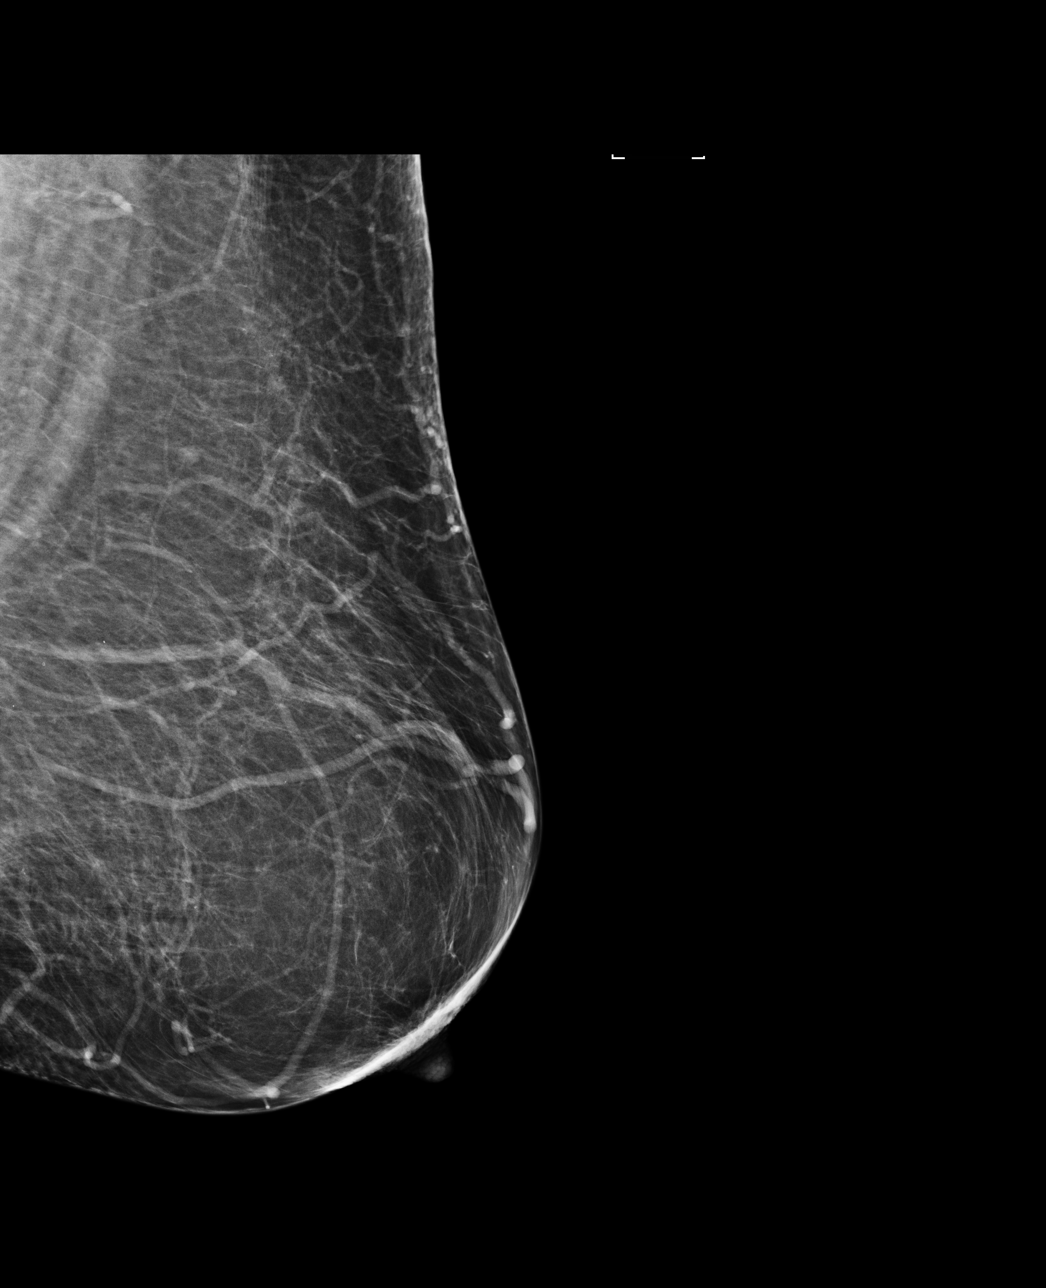

[4 of 4 positions shown; findings below may reference images not displayed]

FINDINGS: There are no findings suspicious for malignancy. Images were
processed with CAD.
IMPRESSION: No mammographic evidence of malignancy. A result letter of this
screening mammogram will be mailed directly to the patient.

RECOMMENDATION:
Screening mammogram in one year. (Code:MV-W-8NO)

BI-RADS CATEGORY  1: Negative.

## 2021-04-27 DIAGNOSIS — Z20822 Contact with and (suspected) exposure to covid-19: Secondary | ICD-10-CM | POA: Diagnosis not present

## 2021-06-02 ENCOUNTER — Other Ambulatory Visit: Payer: Self-pay | Admitting: Family Medicine

## 2021-06-02 DIAGNOSIS — Z1231 Encounter for screening mammogram for malignant neoplasm of breast: Secondary | ICD-10-CM

## 2021-06-13 DIAGNOSIS — N183 Chronic kidney disease, stage 3 unspecified: Secondary | ICD-10-CM | POA: Diagnosis not present

## 2021-06-15 DIAGNOSIS — N2581 Secondary hyperparathyroidism of renal origin: Secondary | ICD-10-CM | POA: Diagnosis not present

## 2021-06-15 DIAGNOSIS — D631 Anemia in chronic kidney disease: Secondary | ICD-10-CM | POA: Diagnosis not present

## 2021-06-15 DIAGNOSIS — N183 Chronic kidney disease, stage 3 unspecified: Secondary | ICD-10-CM | POA: Diagnosis not present

## 2021-06-15 DIAGNOSIS — I129 Hypertensive chronic kidney disease with stage 1 through stage 4 chronic kidney disease, or unspecified chronic kidney disease: Secondary | ICD-10-CM | POA: Diagnosis not present

## 2021-06-28 ENCOUNTER — Other Ambulatory Visit: Payer: Self-pay

## 2021-06-28 ENCOUNTER — Encounter: Payer: Self-pay | Admitting: Podiatry

## 2021-06-28 ENCOUNTER — Ambulatory Visit: Payer: Medicare Other | Admitting: Podiatry

## 2021-06-28 DIAGNOSIS — B351 Tinea unguium: Secondary | ICD-10-CM | POA: Diagnosis not present

## 2021-06-28 DIAGNOSIS — M79676 Pain in unspecified toe(s): Secondary | ICD-10-CM

## 2021-07-01 NOTE — Progress Notes (Signed)
  Subjective:  Patient ID: Frances Graves, female    DOB: 1951/06/04,  MRN: [id number]  Frances Graves presents to clinic today for painful elongated mycotic toenails 1-5 bilaterally which are tender when wearing enclosed shoe gear. Pain is relieved with periodic professional debridement.  Patient notes no new pedal complaints on today's visit.  PCP is Frances Pillar, Frances Graves.  Allergies  Allergen Reactions   Other Anaphylaxis    Other reaction(s): Unknown   Ammoniated Mercury     Other reaction(s): Unknown   Iodinated Diagnostic Agents    Iodine     Other reaction(s): Unknown   Lisinopril     Other reaction(s): Unknown   Mercury    Morphine And Related     Other reaction(s): Unknown   Tape     Review of Systems: Negative except as noted in the HPI. Objective:   Constitutional Conception Frances Graves is a pleasant 70 y.o. Caucasian female, morbidly obese in NAD. AAO x 3.   Vascular CFT immediate b/l LE. Palpable DP/PT pulses b/l LE. Digital hair sparse b/l. Skin temperature gradient WNL b/l. No pain with calf compression b/l. No edema noted b/l. No cyanosis or clubbing noted b/l LE.  Neurologic Normal speech. Oriented to person, place, and time. Protective sensation intact 5/5 intact bilaterally with 10g monofilament b/l. Vibratory sensation intact b/l.  Dermatologic Pedal integument with normal turgor, texture and tone b/l LE. No open wounds b/l. No interdigital macerations b/l. Toenails 1-5 b/l elongated, thickened, discolored with subungual debris. +Tenderness with dorsal palpation of nailplates. No hyperkeratotic or porokeratotic lesions present. Rash resolved b/l..  Orthopedic: Muscle strength 5/5 to all lower extremity muscle groups bilaterally. No pain, crepitus or joint limitation noted with ROM bilateral LE. No gross bony deformities bilaterally.   Radiographs: None   Assessment:   1. Pain due to onychomycosis of toenail    Plan:  Patient was evaluated and treated and all  questions answered. Consent given for treatment as described below: -No new findings. No new orders. -Patient to continue soft, supportive shoe gear daily. -Mycotic toenails 1-5 bilaterally were debrided in length and girth with sterile nail nippers and dremel without incident. -Patient/POA to call should there be question/concern in the interim.  Return in about 3 months (around 09/28/2021).  Frances Graves, Frances Graves

## 2021-07-05 DIAGNOSIS — E875 Hyperkalemia: Secondary | ICD-10-CM | POA: Diagnosis not present

## 2021-07-05 DIAGNOSIS — N2581 Secondary hyperparathyroidism of renal origin: Secondary | ICD-10-CM | POA: Diagnosis not present

## 2021-07-05 DIAGNOSIS — M109 Gout, unspecified: Secondary | ICD-10-CM | POA: Diagnosis not present

## 2021-07-05 DIAGNOSIS — D631 Anemia in chronic kidney disease: Secondary | ICD-10-CM | POA: Diagnosis not present

## 2021-07-05 DIAGNOSIS — N183 Chronic kidney disease, stage 3 unspecified: Secondary | ICD-10-CM | POA: Diagnosis not present

## 2021-07-06 ENCOUNTER — Ambulatory Visit
Admission: RE | Admit: 2021-07-06 | Discharge: 2021-07-06 | Disposition: A | Discharge status: Home / Self Care | Payer: Medicare Other | Source: Ambulatory Visit | Attending: Family Medicine | Admitting: Family Medicine

## 2021-07-06 ENCOUNTER — Other Ambulatory Visit: Payer: Self-pay

## 2021-07-06 DIAGNOSIS — N2581 Secondary hyperparathyroidism of renal origin: Secondary | ICD-10-CM | POA: Diagnosis not present

## 2021-07-06 DIAGNOSIS — Z1231 Encounter for screening mammogram for malignant neoplasm of breast: Secondary | ICD-10-CM

## 2021-08-03 DIAGNOSIS — N183 Chronic kidney disease, stage 3 unspecified: Secondary | ICD-10-CM | POA: Diagnosis not present

## 2021-08-08 DIAGNOSIS — M109 Gout, unspecified: Secondary | ICD-10-CM | POA: Diagnosis not present

## 2021-08-08 DIAGNOSIS — Z0000 Encounter for general adult medical examination without abnormal findings: Secondary | ICD-10-CM | POA: Diagnosis not present

## 2021-08-08 DIAGNOSIS — I129 Hypertensive chronic kidney disease with stage 1 through stage 4 chronic kidney disease, or unspecified chronic kidney disease: Secondary | ICD-10-CM | POA: Diagnosis not present

## 2021-08-08 DIAGNOSIS — N189 Chronic kidney disease, unspecified: Secondary | ICD-10-CM | POA: Diagnosis not present

## 2021-08-08 DIAGNOSIS — E039 Hypothyroidism, unspecified: Secondary | ICD-10-CM | POA: Diagnosis not present

## 2021-10-06 ENCOUNTER — Ambulatory Visit: Payer: Medicare Other | Admitting: Podiatry

## 2021-10-25 ENCOUNTER — Ambulatory Visit: Payer: Medicare Other | Admitting: Podiatry

## 2021-10-31 DIAGNOSIS — N189 Chronic kidney disease, unspecified: Secondary | ICD-10-CM | POA: Diagnosis not present

## 2021-10-31 DIAGNOSIS — N183 Chronic kidney disease, stage 3 unspecified: Secondary | ICD-10-CM | POA: Diagnosis not present

## 2021-11-02 DIAGNOSIS — I129 Hypertensive chronic kidney disease with stage 1 through stage 4 chronic kidney disease, or unspecified chronic kidney disease: Secondary | ICD-10-CM | POA: Diagnosis not present

## 2021-11-02 DIAGNOSIS — D631 Anemia in chronic kidney disease: Secondary | ICD-10-CM | POA: Diagnosis not present

## 2021-11-02 DIAGNOSIS — N179 Acute kidney failure, unspecified: Secondary | ICD-10-CM | POA: Diagnosis not present

## 2021-11-02 DIAGNOSIS — N183 Chronic kidney disease, stage 3 unspecified: Secondary | ICD-10-CM | POA: Diagnosis not present

## 2021-11-23 DIAGNOSIS — N183 Chronic kidney disease, stage 3 unspecified: Secondary | ICD-10-CM | POA: Diagnosis not present

## 2021-11-23 DIAGNOSIS — I129 Hypertensive chronic kidney disease with stage 1 through stage 4 chronic kidney disease, or unspecified chronic kidney disease: Secondary | ICD-10-CM | POA: Diagnosis not present

## 2021-12-12 DIAGNOSIS — N183 Chronic kidney disease, stage 3 unspecified: Secondary | ICD-10-CM | POA: Diagnosis not present

## 2021-12-13 DIAGNOSIS — D3131 Benign neoplasm of right choroid: Secondary | ICD-10-CM | POA: Diagnosis not present

## 2021-12-13 DIAGNOSIS — D3132 Benign neoplasm of left choroid: Secondary | ICD-10-CM | POA: Diagnosis not present

## 2021-12-13 DIAGNOSIS — H353132 Nonexudative age-related macular degeneration, bilateral, intermediate dry stage: Secondary | ICD-10-CM | POA: Diagnosis not present

## 2021-12-13 DIAGNOSIS — H47323 Drusen of optic disc, bilateral: Secondary | ICD-10-CM | POA: Diagnosis not present

## 2022-02-07 DIAGNOSIS — E039 Hypothyroidism, unspecified: Secondary | ICD-10-CM | POA: Diagnosis not present

## 2022-03-01 DIAGNOSIS — M1991 Primary osteoarthritis, unspecified site: Secondary | ICD-10-CM | POA: Diagnosis not present

## 2022-03-01 DIAGNOSIS — M79641 Pain in right hand: Secondary | ICD-10-CM | POA: Diagnosis not present

## 2022-03-01 DIAGNOSIS — M1009 Idiopathic gout, multiple sites: Secondary | ICD-10-CM | POA: Diagnosis not present

## 2022-03-01 DIAGNOSIS — M79642 Pain in left hand: Secondary | ICD-10-CM | POA: Diagnosis not present

## 2022-03-29 DIAGNOSIS — M1991 Primary osteoarthritis, unspecified site: Secondary | ICD-10-CM | POA: Diagnosis not present

## 2022-03-29 DIAGNOSIS — M1009 Idiopathic gout, multiple sites: Secondary | ICD-10-CM | POA: Diagnosis not present

## 2022-03-29 DIAGNOSIS — M79642 Pain in left hand: Secondary | ICD-10-CM | POA: Diagnosis not present

## 2022-03-29 DIAGNOSIS — M0600 Rheumatoid arthritis without rheumatoid factor, unspecified site: Secondary | ICD-10-CM | POA: Diagnosis not present

## 2022-04-03 DIAGNOSIS — N183 Chronic kidney disease, stage 3 unspecified: Secondary | ICD-10-CM | POA: Diagnosis not present

## 2022-04-03 DIAGNOSIS — N189 Chronic kidney disease, unspecified: Secondary | ICD-10-CM | POA: Diagnosis not present

## 2022-04-03 DIAGNOSIS — I129 Hypertensive chronic kidney disease with stage 1 through stage 4 chronic kidney disease, or unspecified chronic kidney disease: Secondary | ICD-10-CM | POA: Diagnosis not present

## 2022-04-03 DIAGNOSIS — N2581 Secondary hyperparathyroidism of renal origin: Secondary | ICD-10-CM | POA: Diagnosis not present

## 2022-04-03 DIAGNOSIS — D631 Anemia in chronic kidney disease: Secondary | ICD-10-CM | POA: Diagnosis not present

## 2022-04-03 DIAGNOSIS — N179 Acute kidney failure, unspecified: Secondary | ICD-10-CM | POA: Diagnosis not present

## 2022-04-17 DIAGNOSIS — I129 Hypertensive chronic kidney disease with stage 1 through stage 4 chronic kidney disease, or unspecified chronic kidney disease: Secondary | ICD-10-CM | POA: Diagnosis not present

## 2022-04-17 DIAGNOSIS — N183 Chronic kidney disease, stage 3 unspecified: Secondary | ICD-10-CM | POA: Diagnosis not present

## 2022-05-17 IMAGING — MG DIGITAL SCREENING BILAT W/ CAD
4 series · 4 of 4 positions shown · non-contrast
Comparison: Previous exam(s).

CLINICAL DATA: Screening.

EXAM:
DIGITAL SCREENING BILATERAL MAMMOGRAM WITH CAD

[R MLO]
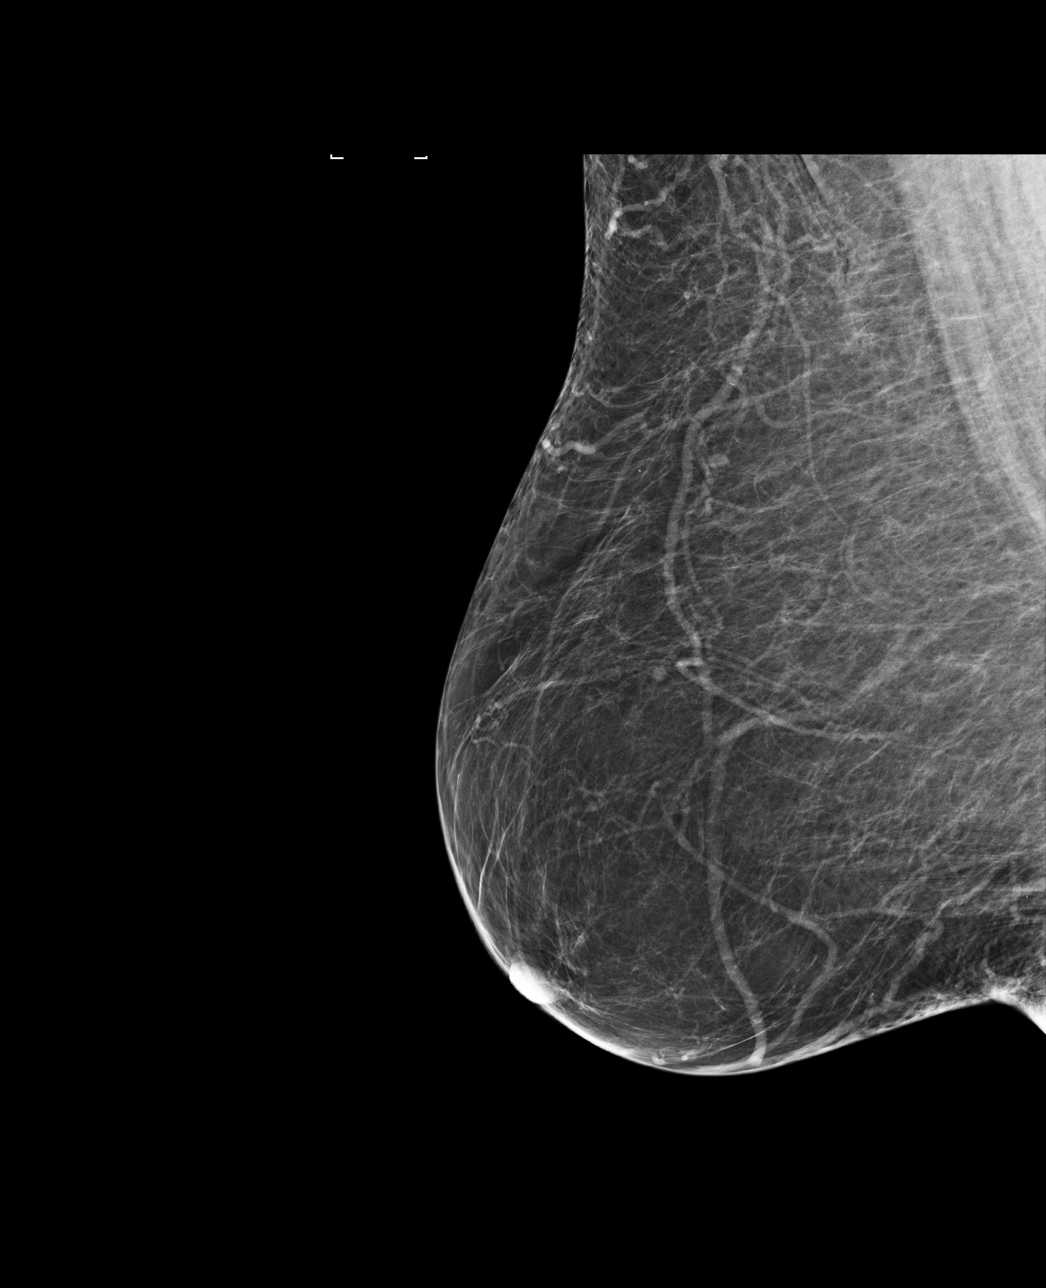

[L MLO]
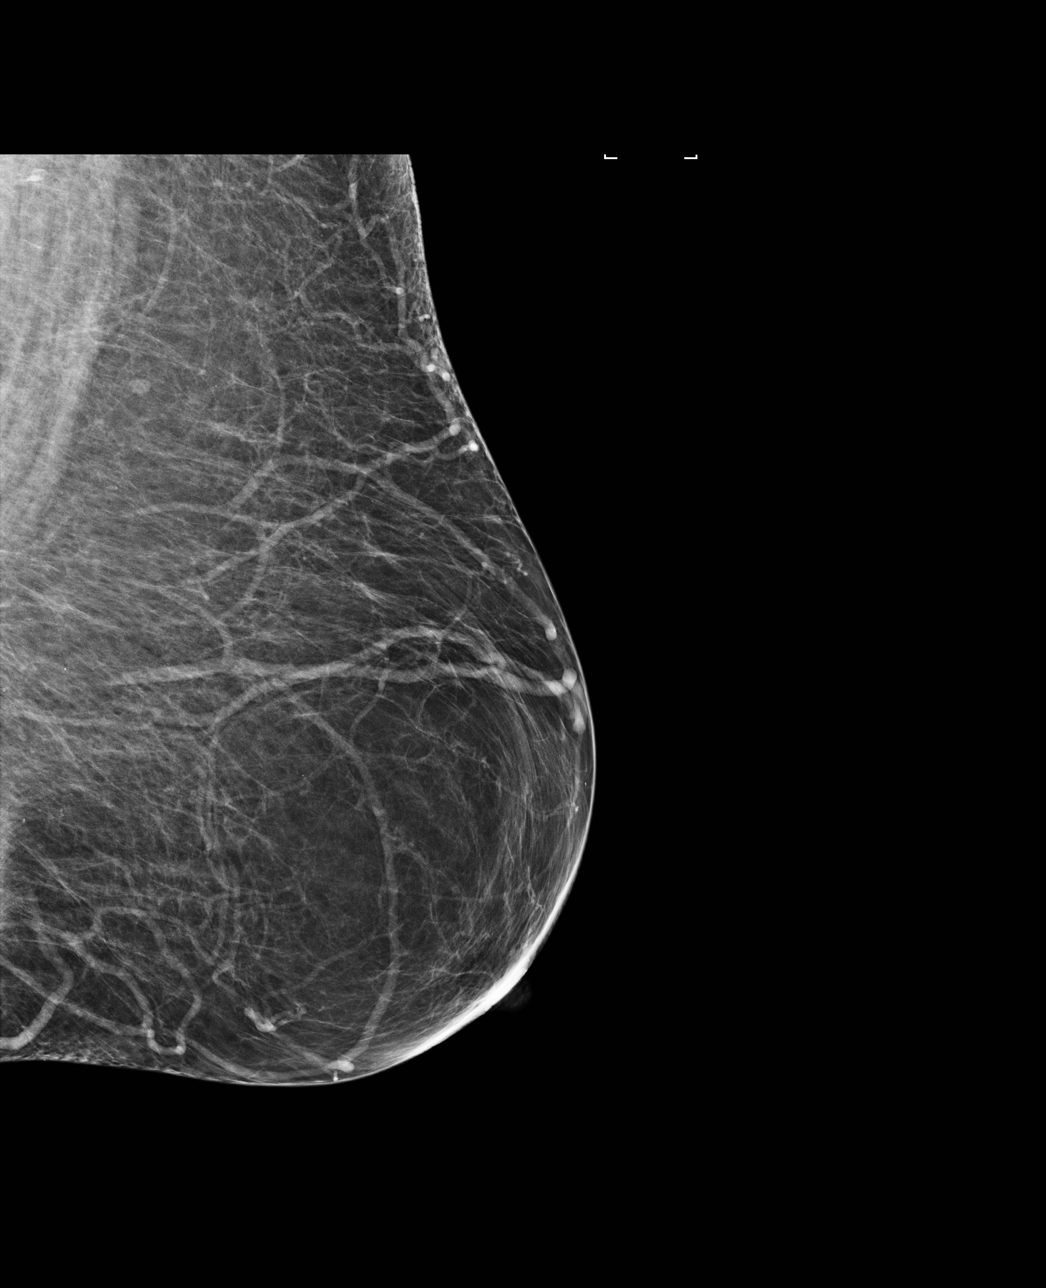

[R CC]
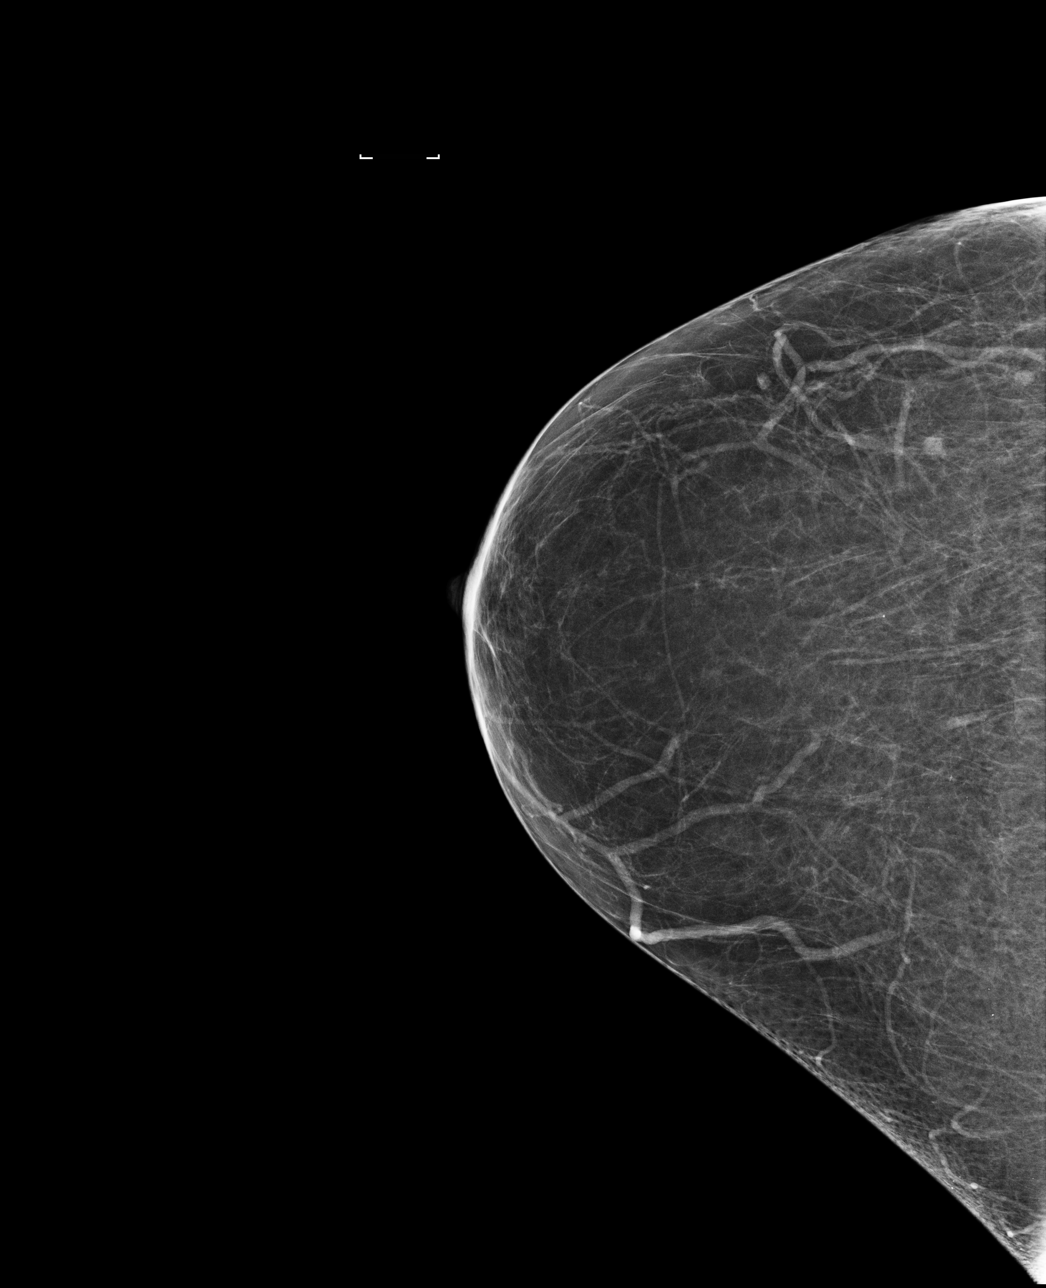

[L CC]
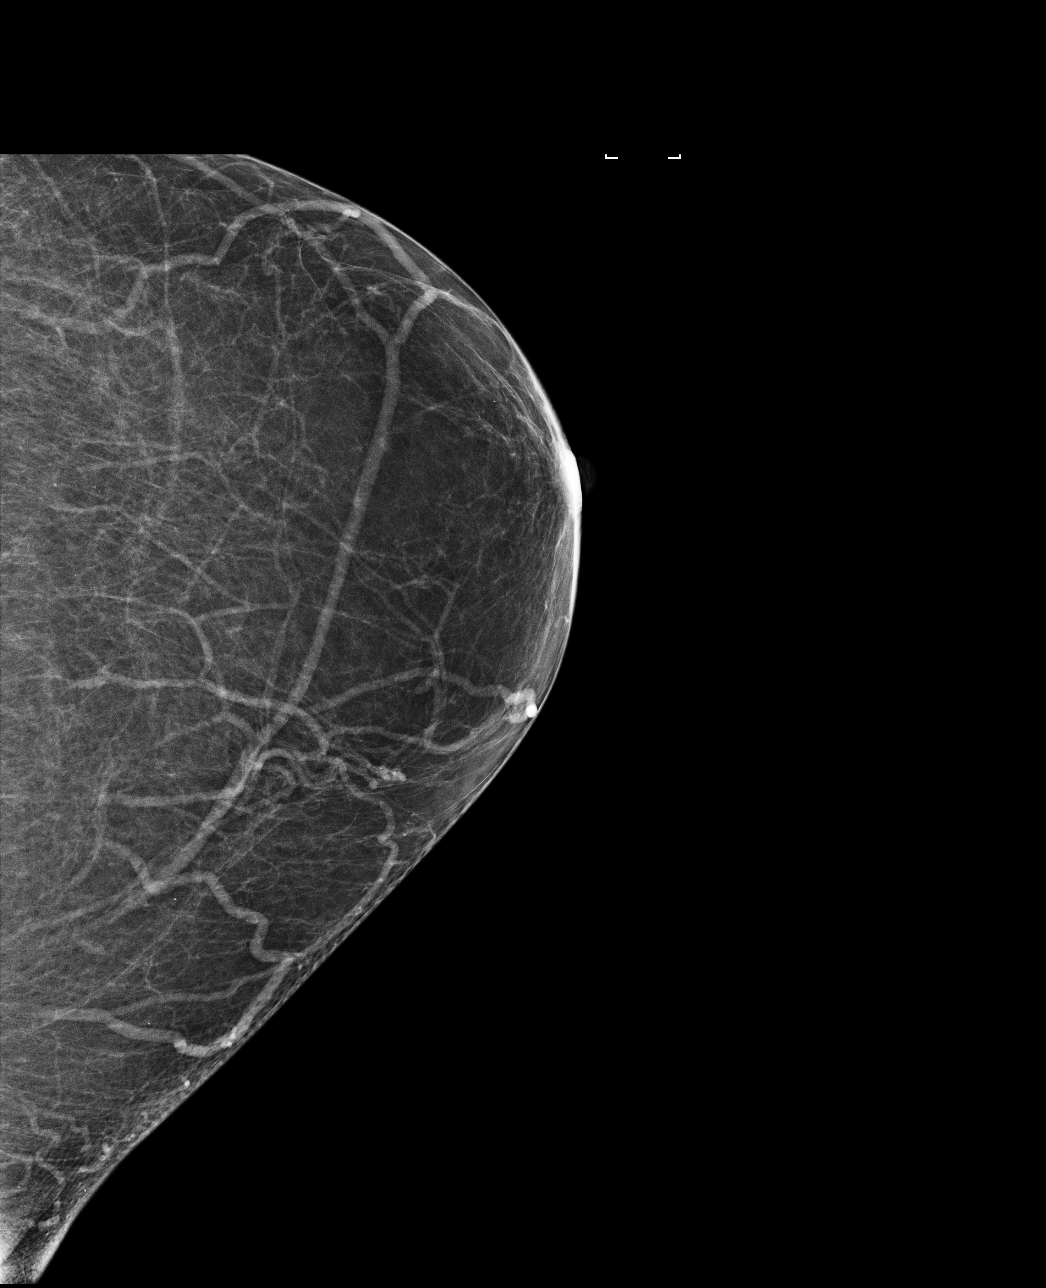

[4 of 4 positions shown; findings below may reference images not displayed]

ACR Breast Density Category b: There are scattered areas of
fibroglandular density.
FINDINGS: There are no findings suspicious for malignancy. Images were
processed with CAD.
IMPRESSION: No mammographic evidence of malignancy. A result letter of this
screening mammogram will be mailed directly to the patient.

RECOMMENDATION:
Screening mammogram in one year. (Code:AS-G-LCT)

BI-RADS CATEGORY  1: Negative.

## 2022-07-16 ENCOUNTER — Ambulatory Visit: Payer: Medicare Other | Admitting: Podiatry

## 2022-07-16 ENCOUNTER — Encounter: Payer: Self-pay | Admitting: Podiatry

## 2022-07-16 VITALS — BP 137/65

## 2022-07-16 DIAGNOSIS — L603 Nail dystrophy: Secondary | ICD-10-CM

## 2022-07-16 DIAGNOSIS — R21 Rash and other nonspecific skin eruption: Secondary | ICD-10-CM | POA: Diagnosis not present

## 2022-07-16 DIAGNOSIS — B351 Tinea unguium: Secondary | ICD-10-CM | POA: Diagnosis not present

## 2022-07-16 DIAGNOSIS — M79676 Pain in unspecified toe(s): Secondary | ICD-10-CM | POA: Diagnosis not present

## 2022-07-16 DIAGNOSIS — M0600 Rheumatoid arthritis without rheumatoid factor, unspecified site: Secondary | ICD-10-CM | POA: Insufficient documentation

## 2022-07-16 DIAGNOSIS — R768 Other specified abnormal immunological findings in serum: Secondary | ICD-10-CM | POA: Insufficient documentation

## 2022-07-16 MED ORDER — TRIAMCINOLONE ACETONIDE 0.1 % EX CREA: TOPICAL_CREAM | CUTANEOUS | 0 refills | Status: DC

## 2022-07-16 NOTE — Progress Notes (Addendum)
  Subjective:  Patient ID: Frances Graves, female    DOB: 02/04/1951,  MRN: [id number]  Frances Flight presents to clinic today for painful thick toenails that are difficult to trim. Pain interferes with ambulation. Aggravating factors include wearing enclosed shoe gear. Pain is relieved with periodic professional debridement.  Chief Complaint  Patient presents with   Nail Problem    RFC PCP-Pawhni, PCP [facility]- Have not met her yet   PCP is Pahwani, Rinka R, MD.  Allergies  Allergen Reactions   Other Anaphylaxis    Other reaction(s): Unknown   Amlodipine     Other Reaction(s): Unknown   Ammoniated Mercury     Other reaction(s): Unknown   Iodinated Contrast Media    Iodine     Other reaction(s): Unknown   Lisinopril     Other reaction(s): Unknown   Losartan     Other Reaction(s): Unknown   Mercury    Morphine And Related     Other reaction(s): Unknown   Tape    Review of Systems: Negative except as noted in the HPI.  Objective:  Vitals:   07/16/22 1547  BP: 137/65   Frances Graves is a pleasant 71 y.o. female morbidly obese in NAD. AAO x 3. Vascular CFT immediate b/l LE. Palpable DP/PT pulses b/l LE. Digital hair sparse b/l. Skin temperature gradient WNL b/l. No pain with calf compression b/l. No edema noted b/l. No cyanosis or clubbing noted b/l LE.  Neurologic Normal speech. Oriented to person, place, and time. Protective sensation intact 5/5 intact bilaterally with 10g monofilament b/l. Vibratory sensation intact b/l.  Dermatologic Pedal integument with normal turgor, texture and tone b/l LE. No open wounds b/l. No interdigital macerations b/l.   Toenails 2-5 b/l and left great toe elongated, thickened, discolored with subungual debris. +Tenderness with dorsal palpation of nailplates.   Right great toe with double nailplate. There is nail border hypertrophy and fungal debris noted at proximal nailfold. No drainage, but there is tenderness to palpation and blanchable  erythema.  No hyperkeratotic or porokeratotic lesions present.   Mild skin eruption noted on right ankle and dorsum of the right foot.  Orthopedic: Muscle strength 5/5 to all lower extremity muscle groups bilaterally. No pain, crepitus or joint limitation noted with ROM bilateral LE. No gross bony deformities bilaterally.   Radiographs: None  Assessment/Plan: 1. Pain due to onychomycosis of toenail   2. Nail dystrophy   3. Rash of foot     Meds ordered this encounter  Medications   triamcinolone cream (KENALOG) 0.1 %    Sig: Apply to rash on feet twice daily until resolved.    Dispense:  454 g    Refill:  0    -Consent given for treatment as described below: -Examined patient. -Curretaged fungal debris from proximal nailfold. Cleansed with alcohol and applied triple antibiotic ointment. She was instructed to apply Neosporin to toe once daily. Advised patient to schedule appointment with Dr. Milinda Pointer for removal of nailplate if it becomes symptomatic. She related understanding. -New Rx sent to pharmacy for triamcinolone ointment 0.1% for rash. -Continue supportive shoe gear daily. -Toenails 1-5 b/l were debrided in length and girth with sterile nail nippers and dremel without iatrogenic bleeding.  -Patient/POA to call should there be question/concern in the interim.   Return in about 3 months (around 10/15/2022).  Marzetta Board, DPM

## 2022-07-21 NOTE — Addendum Note (Signed)
Addended by: Marzetta Board on: 07/21/2022 11:16 AM   Modules accepted: Level of Service

## 2022-08-09 DIAGNOSIS — Z0000 Encounter for general adult medical examination without abnormal findings: Secondary | ICD-10-CM | POA: Diagnosis not present

## 2022-08-09 DIAGNOSIS — J069 Acute upper respiratory infection, unspecified: Secondary | ICD-10-CM | POA: Diagnosis not present

## 2022-08-09 DIAGNOSIS — Z23 Encounter for immunization: Secondary | ICD-10-CM | POA: Diagnosis not present

## 2022-08-09 DIAGNOSIS — I129 Hypertensive chronic kidney disease with stage 1 through stage 4 chronic kidney disease, or unspecified chronic kidney disease: Secondary | ICD-10-CM | POA: Diagnosis not present

## 2022-08-09 DIAGNOSIS — M1990 Unspecified osteoarthritis, unspecified site: Secondary | ICD-10-CM | POA: Diagnosis not present

## 2022-08-09 DIAGNOSIS — E039 Hypothyroidism, unspecified: Secondary | ICD-10-CM | POA: Diagnosis not present

## 2022-08-10 ENCOUNTER — Other Ambulatory Visit: Payer: Self-pay | Admitting: Internal Medicine

## 2022-08-10 DIAGNOSIS — E039 Hypothyroidism, unspecified: Secondary | ICD-10-CM | POA: Diagnosis not present

## 2022-08-10 DIAGNOSIS — E7800 Pure hypercholesterolemia, unspecified: Secondary | ICD-10-CM | POA: Diagnosis not present

## 2022-08-10 DIAGNOSIS — E559 Vitamin D deficiency, unspecified: Secondary | ICD-10-CM | POA: Diagnosis not present

## 2022-08-10 DIAGNOSIS — I129 Hypertensive chronic kidney disease with stage 1 through stage 4 chronic kidney disease, or unspecified chronic kidney disease: Secondary | ICD-10-CM | POA: Diagnosis not present

## 2022-08-10 DIAGNOSIS — M810 Age-related osteoporosis without current pathological fracture: Secondary | ICD-10-CM

## 2022-08-27 DIAGNOSIS — N183 Chronic kidney disease, stage 3 unspecified: Secondary | ICD-10-CM | POA: Diagnosis not present

## 2022-08-28 ENCOUNTER — Other Ambulatory Visit: Payer: Self-pay | Admitting: Internal Medicine

## 2022-08-28 DIAGNOSIS — Z1231 Encounter for screening mammogram for malignant neoplasm of breast: Secondary | ICD-10-CM

## 2022-08-28 DIAGNOSIS — M810 Age-related osteoporosis without current pathological fracture: Secondary | ICD-10-CM

## 2022-08-28 DIAGNOSIS — N183 Chronic kidney disease, stage 3 unspecified: Secondary | ICD-10-CM | POA: Diagnosis not present

## 2022-09-11 DIAGNOSIS — N179 Acute kidney failure, unspecified: Secondary | ICD-10-CM | POA: Diagnosis not present

## 2022-09-11 DIAGNOSIS — D631 Anemia in chronic kidney disease: Secondary | ICD-10-CM | POA: Diagnosis not present

## 2022-09-11 DIAGNOSIS — I129 Hypertensive chronic kidney disease with stage 1 through stage 4 chronic kidney disease, or unspecified chronic kidney disease: Secondary | ICD-10-CM | POA: Diagnosis not present

## 2022-09-11 DIAGNOSIS — N183 Chronic kidney disease, stage 3 unspecified: Secondary | ICD-10-CM | POA: Diagnosis not present

## 2022-10-04 ENCOUNTER — Ambulatory Visit: Payer: Medicare Other

## 2022-10-04 ENCOUNTER — Other Ambulatory Visit: Payer: Medicare Other

## 2022-10-08 DIAGNOSIS — M79642 Pain in left hand: Secondary | ICD-10-CM | POA: Diagnosis not present

## 2022-10-08 DIAGNOSIS — M1009 Idiopathic gout, multiple sites: Secondary | ICD-10-CM | POA: Diagnosis not present

## 2022-10-08 DIAGNOSIS — M0600 Rheumatoid arthritis without rheumatoid factor, unspecified site: Secondary | ICD-10-CM | POA: Diagnosis not present

## 2022-10-08 DIAGNOSIS — M1991 Primary osteoarthritis, unspecified site: Secondary | ICD-10-CM | POA: Diagnosis not present

## 2022-10-30 ENCOUNTER — Ambulatory Visit: Payer: Medicare Other | Admitting: Podiatrist

## 2022-10-30 DIAGNOSIS — B351 Tinea unguium: Secondary | ICD-10-CM

## 2022-10-30 DIAGNOSIS — M79676 Pain in unspecified toe(s): Secondary | ICD-10-CM

## 2022-11-05 NOTE — Progress Notes (Signed)
  Subjective:  Patient ID: Frances Graves, female    DOB: 01-Dec-1950,  MRN: [id number]  Frances Graves presents to clinic today for painful thick toenails that are difficult to trim. Pain interferes with ambulation. Aggravating factors include wearing enclosed shoe gear. Pain is relieved with periodic professional debridement.  Chief Complaint  Patient presents with   Nail Problem    RFC PCP-Pahwani PCP [facility]-08/2022   PCP is Pahwani, Rinka R, MD.  Allergies  Allergen Reactions   Other Anaphylaxis    Other reaction(s): Unknown   Amlodipine     Other Reaction(s): Unknown   Ammoniated Mercury     Other reaction(s): Unknown   Iodinated Contrast Media    Iodine     Other reaction(s): Unknown   Lisinopril     Other reaction(s): Unknown   Losartan     Other Reaction(s): Unknown   Mercury    Morphine And Related     Other reaction(s): Unknown   Tape    Review of Systems: Negative except as noted in the HPI.  Objective:  There were no vitals filed for this visit.  Frances Graves is a pleasant 72 y.o. female morbidly obese in NAD. AAO x 3. Vascular CFT immediate b/l LE. Palpable DP/PT pulses b/l LE. Digital hair sparse b/l. Skin temperature gradient WNL b/l. No pain with calf compression b/l. No edema noted b/l. No cyanosis or clubbing noted b/l LE.  Neurologic Normal speech. Oriented to person, place, and time. Protective sensation intact 5/5 intact bilaterally with 10g monofilament b/l. Vibratory sensation intact b/l.  Dermatologic Pedal integument with normal turgor, texture and tone b/l LE. No open wounds b/l. No interdigital macerations b/l.   Toenails 2-5 b/l and left great toe elongated, thickened, discolored with subungual debris. +Tenderness with dorsal palpation of nailplates.   No hyperkeratotic or porokeratotic lesions present.    Orthopedic: Muscle strength 5/5 to all lower extremity muscle groups bilaterally. No pain, crepitus or joint limitation noted with ROM  bilateral LE. No gross bony deformities bilaterally.   Radiographs: None  Assessment/Plan:   ICD-10-CM   1. Pain due to onychomycosis of toenail  B35.1    M79.676        -Consent given for treatment as described below: -Examined patient. -Continue supportive shoe gear daily. -Toenails 1-5 b/l were debrided in length and girth with sterile nail nippers and dremel without iatrogenic bleeding.  -Patient/POA to call should there be question/concern in the interim.     Bronson Ing, DPM

## 2022-11-20 ENCOUNTER — Other Ambulatory Visit: Payer: Medicare Other

## 2022-11-20 ENCOUNTER — Ambulatory Visit: Payer: Medicare Other

## 2022-12-19 DIAGNOSIS — D3131 Benign neoplasm of right choroid: Secondary | ICD-10-CM | POA: Diagnosis not present

## 2022-12-19 DIAGNOSIS — Z79899 Other long term (current) drug therapy: Secondary | ICD-10-CM | POA: Diagnosis not present

## 2022-12-19 DIAGNOSIS — M069 Rheumatoid arthritis, unspecified: Secondary | ICD-10-CM | POA: Diagnosis not present

## 2022-12-19 DIAGNOSIS — H47323 Drusen of optic disc, bilateral: Secondary | ICD-10-CM | POA: Diagnosis not present

## 2023-01-09 DIAGNOSIS — N183 Chronic kidney disease, stage 3 unspecified: Secondary | ICD-10-CM | POA: Diagnosis not present

## 2023-01-09 DIAGNOSIS — N189 Chronic kidney disease, unspecified: Secondary | ICD-10-CM | POA: Diagnosis not present

## 2023-01-15 DIAGNOSIS — I129 Hypertensive chronic kidney disease with stage 1 through stage 4 chronic kidney disease, or unspecified chronic kidney disease: Secondary | ICD-10-CM | POA: Diagnosis not present

## 2023-01-15 DIAGNOSIS — N183 Chronic kidney disease, stage 3 unspecified: Secondary | ICD-10-CM | POA: Diagnosis not present

## 2023-01-15 DIAGNOSIS — D631 Anemia in chronic kidney disease: Secondary | ICD-10-CM | POA: Diagnosis not present

## 2023-01-15 DIAGNOSIS — N2581 Secondary hyperparathyroidism of renal origin: Secondary | ICD-10-CM | POA: Diagnosis not present

## 2023-02-22 DIAGNOSIS — N183 Chronic kidney disease, stage 3 unspecified: Secondary | ICD-10-CM | POA: Diagnosis not present

## 2023-03-13 ENCOUNTER — Encounter: Payer: Self-pay | Admitting: Podiatry

## 2023-03-13 ENCOUNTER — Ambulatory Visit: Payer: Medicare Other | Admitting: Podiatry

## 2023-03-13 DIAGNOSIS — B351 Tinea unguium: Secondary | ICD-10-CM | POA: Diagnosis not present

## 2023-03-13 DIAGNOSIS — M79676 Pain in unspecified toe(s): Secondary | ICD-10-CM

## 2023-03-13 NOTE — Progress Notes (Signed)
  Subjective:  Patient ID: Frances Graves, female    DOB: May 07, 1951,  MRN: [id number]  Chanelle Seraphin presents to clinic today for: painful elongated mycotic toenails 1-5 bilaterally which are tender when wearing enclosed shoe gear. Pain is relieved with periodic professional debridement.  Chief Complaint  Patient presents with   RFC    RFC    PCP is Pahwani, Rinka R, MD.  Allergies  Allergen Reactions   Other Anaphylaxis    Other reaction(s): Unknown   Amlodipine     Other Reaction(s): Unknown   Ammoniated Mercury     Other reaction(s): Unknown   Iodinated Contrast Media    Iodine     Other reaction(s): Unknown   Lisinopril     Other reaction(s): Unknown   Losartan     Other Reaction(s): Unknown   Mercury    Morphine And Codeine     Other reaction(s): Unknown   Tape     Review of Systems: Negative except as noted in the HPI.  Objective: No changes noted in today's physical examination. There were no vitals filed for this visit.  Reber Judy is a pleasant 72 y.o. female in NAD. AAO x 3.  Vascular Examination: Capillary refill time <3 seconds b/l LE. Palpable pedal pulses b/l LE. Digital hair sparse b/l. No pedal edema b/l. Skin temperature gradient WNL b/l. No varicosities b/l. Marland Kitchen  Dermatological Examination: Pedal skin with normal turgor, texture and tone b/l. No open wounds. No interdigital macerations b/l. Toenails 1-5 b/l thickened, discolored, dystrophic with subungual debris. There is pain on palpation to dorsal aspect of nailplates.  Minimal hyperkeratosis right hallux..  Neurological Examination: Protective sensation intact with 10 gram monofilament b/l LE. Vibratory sensation intact b/l LE.   Musculoskeletal Examination: Muscle strength 5/5 to all lower extremity muscle groups bilaterally. No pain, crepitus or joint limitation noted with ROM bilateral LE. No gross bony deformities bilaterally.  Assessment/Plan: 1. Pain due to onychomycosis of  toenail    -Consent given for treatment as described below: -Examined patient. -Patient to continue soft, supportive shoe gear daily. -Mycotic toenails 1-5 bilaterally were debrided in length and girth with sterile nail nippers and dremel without incident. -Patient/POA to call should there be question/concern in the interim.   Return in about 3 months (around 06/13/2023).  Freddie Breech, DPM

## 2023-04-16 DIAGNOSIS — M79641 Pain in right hand: Secondary | ICD-10-CM | POA: Diagnosis not present

## 2023-04-16 DIAGNOSIS — M1991 Primary osteoarthritis, unspecified site: Secondary | ICD-10-CM | POA: Diagnosis not present

## 2023-04-16 DIAGNOSIS — M0600 Rheumatoid arthritis without rheumatoid factor, unspecified site: Secondary | ICD-10-CM | POA: Diagnosis not present

## 2023-04-16 DIAGNOSIS — Z79899 Other long term (current) drug therapy: Secondary | ICD-10-CM | POA: Diagnosis not present

## 2023-04-16 DIAGNOSIS — M1009 Idiopathic gout, multiple sites: Secondary | ICD-10-CM | POA: Diagnosis not present

## 2023-04-16 DIAGNOSIS — M79642 Pain in left hand: Secondary | ICD-10-CM | POA: Diagnosis not present

## 2023-04-26 ENCOUNTER — Ambulatory Visit
Admission: RE | Admit: 2023-04-26 | Discharge: 2023-04-26 | Disposition: A | Discharge status: Home / Self Care | Payer: Medicare Other | Source: Ambulatory Visit | Attending: Internal Medicine | Admitting: Internal Medicine

## 2023-04-26 DIAGNOSIS — Z1231 Encounter for screening mammogram for malignant neoplasm of breast: Secondary | ICD-10-CM

## 2023-04-26 DIAGNOSIS — N958 Other specified menopausal and perimenopausal disorders: Secondary | ICD-10-CM | POA: Diagnosis not present

## 2023-04-26 DIAGNOSIS — M069 Rheumatoid arthritis, unspecified: Secondary | ICD-10-CM | POA: Diagnosis not present

## 2023-04-26 DIAGNOSIS — M810 Age-related osteoporosis without current pathological fracture: Secondary | ICD-10-CM

## 2023-04-26 DIAGNOSIS — M8588 Other specified disorders of bone density and structure, other site: Secondary | ICD-10-CM | POA: Diagnosis not present

## 2023-04-26 DIAGNOSIS — E2839 Other primary ovarian failure: Secondary | ICD-10-CM | POA: Diagnosis not present

## 2023-05-17 DIAGNOSIS — N183 Chronic kidney disease, stage 3 unspecified: Secondary | ICD-10-CM | POA: Diagnosis not present

## 2023-05-22 DIAGNOSIS — N2581 Secondary hyperparathyroidism of renal origin: Secondary | ICD-10-CM | POA: Diagnosis not present

## 2023-05-22 DIAGNOSIS — I129 Hypertensive chronic kidney disease with stage 1 through stage 4 chronic kidney disease, or unspecified chronic kidney disease: Secondary | ICD-10-CM | POA: Diagnosis not present

## 2023-05-22 DIAGNOSIS — D631 Anemia in chronic kidney disease: Secondary | ICD-10-CM | POA: Diagnosis not present

## 2023-05-22 DIAGNOSIS — Z23 Encounter for immunization: Secondary | ICD-10-CM | POA: Diagnosis not present

## 2023-05-22 DIAGNOSIS — N184 Chronic kidney disease, stage 4 (severe): Secondary | ICD-10-CM | POA: Diagnosis not present

## 2023-05-28 DIAGNOSIS — N184 Chronic kidney disease, stage 4 (severe): Secondary | ICD-10-CM | POA: Diagnosis not present

## 2023-06-13 ENCOUNTER — Encounter: Payer: Self-pay | Admitting: Podiatry

## 2023-06-13 ENCOUNTER — Ambulatory Visit: Payer: Medicare Other | Admitting: Podiatry

## 2023-06-13 DIAGNOSIS — B351 Tinea unguium: Secondary | ICD-10-CM

## 2023-06-13 DIAGNOSIS — L603 Nail dystrophy: Secondary | ICD-10-CM

## 2023-06-13 DIAGNOSIS — M79676 Pain in unspecified toe(s): Secondary | ICD-10-CM | POA: Diagnosis not present

## 2023-06-13 NOTE — Progress Notes (Signed)
This patient returns to my office for at risk foot care.  This patient requires this care by a professional since this patient will be at risk due to having CKD.  This patient is unable to cut nails herself since the patient cannot reach her nails.These nails are painful walking and wearing shoes.  This patient presents for at risk foot care today.  General Appearance  Alert, conversant and in no acute stress.  Vascular  Dorsalis pedis and posterior tibial  pulses are palpable  bilaterally.  Capillary return is within normal limits  bilaterally. Temperature is within normal limits  bilaterally.  Neurologic  Senn-Weinstein monofilament wire test within normal limits  bilaterally. Muscle power within normal limits bilaterally.  Nails Thick disfigured discolored nails with subungual debris  from hallux to fifth toes bilaterally. No evidence of bacterial infection or drainage bilaterally.  Orthopedic  No limitations of motion  feet .  No crepitus or effusions noted.  No bony pathology or digital deformities noted.  Skin  normotropic skin with no porokeratosis noted bilaterally.  No signs of infections or ulcers noted.     Onychomycosis  Pain in right toes  Pain in left toes  Consent was obtained for treatment procedures.   Mechanical debridement of nails 1-5  bilaterally performed with a nail nipper.  Filed with dremel without incident.    Return office visit     3 months                Told patient to return for periodic foot care and evaluation due to potential at risk complications.   Laydon Martis DPM   

## 2023-08-14 DIAGNOSIS — Z03818 Encounter for observation for suspected exposure to other biological agents ruled out: Secondary | ICD-10-CM | POA: Diagnosis not present

## 2023-08-14 DIAGNOSIS — E7800 Pure hypercholesterolemia, unspecified: Secondary | ICD-10-CM | POA: Diagnosis not present

## 2023-08-14 DIAGNOSIS — I129 Hypertensive chronic kidney disease with stage 1 through stage 4 chronic kidney disease, or unspecified chronic kidney disease: Secondary | ICD-10-CM | POA: Diagnosis not present

## 2023-08-14 DIAGNOSIS — N184 Chronic kidney disease, stage 4 (severe): Secondary | ICD-10-CM | POA: Diagnosis not present

## 2023-08-14 DIAGNOSIS — R051 Acute cough: Secondary | ICD-10-CM | POA: Diagnosis not present

## 2023-08-14 DIAGNOSIS — E039 Hypothyroidism, unspecified: Secondary | ICD-10-CM | POA: Diagnosis not present

## 2023-08-14 DIAGNOSIS — M810 Age-related osteoporosis without current pathological fracture: Secondary | ICD-10-CM | POA: Diagnosis not present

## 2023-08-14 DIAGNOSIS — Z0000 Encounter for general adult medical examination without abnormal findings: Secondary | ICD-10-CM | POA: Diagnosis not present

## 2023-08-14 DIAGNOSIS — E559 Vitamin D deficiency, unspecified: Secondary | ICD-10-CM | POA: Diagnosis not present

## 2023-08-14 DIAGNOSIS — M0600 Rheumatoid arthritis without rheumatoid factor, unspecified site: Secondary | ICD-10-CM | POA: Diagnosis not present

## 2023-08-23 DIAGNOSIS — N184 Chronic kidney disease, stage 4 (severe): Secondary | ICD-10-CM | POA: Diagnosis not present

## 2023-09-17 ENCOUNTER — Ambulatory Visit: Payer: Medicare Other | Admitting: Podiatry

## 2023-09-24 DIAGNOSIS — D631 Anemia in chronic kidney disease: Secondary | ICD-10-CM | POA: Diagnosis not present

## 2023-09-24 DIAGNOSIS — N2581 Secondary hyperparathyroidism of renal origin: Secondary | ICD-10-CM | POA: Diagnosis not present

## 2023-09-24 DIAGNOSIS — I129 Hypertensive chronic kidney disease with stage 1 through stage 4 chronic kidney disease, or unspecified chronic kidney disease: Secondary | ICD-10-CM | POA: Diagnosis not present

## 2023-09-24 DIAGNOSIS — N184 Chronic kidney disease, stage 4 (severe): Secondary | ICD-10-CM | POA: Diagnosis not present

## 2023-10-15 ENCOUNTER — Ambulatory Visit (INDEPENDENT_AMBULATORY_CARE_PROVIDER_SITE_OTHER): Payer: Medicare Other | Admitting: Podiatry

## 2023-10-15 DIAGNOSIS — Z91198 Patient's noncompliance with other medical treatment and regimen for other reason: Secondary | ICD-10-CM

## 2023-10-17 NOTE — Progress Notes (Signed)
 1. Failure to attend appointment with reason given    Patient is ill and will call to reschedule appointment.

## 2023-12-05 DIAGNOSIS — Z79899 Other long term (current) drug therapy: Secondary | ICD-10-CM | POA: Diagnosis not present

## 2023-12-05 DIAGNOSIS — M0600 Rheumatoid arthritis without rheumatoid factor, unspecified site: Secondary | ICD-10-CM | POA: Diagnosis not present

## 2023-12-05 DIAGNOSIS — M1009 Idiopathic gout, multiple sites: Secondary | ICD-10-CM | POA: Diagnosis not present

## 2023-12-05 DIAGNOSIS — M1991 Primary osteoarthritis, unspecified site: Secondary | ICD-10-CM | POA: Diagnosis not present

## 2023-12-25 ENCOUNTER — Encounter: Payer: Self-pay | Admitting: Podiatry

## 2023-12-25 ENCOUNTER — Ambulatory Visit: Admitting: Podiatry

## 2023-12-25 DIAGNOSIS — B351 Tinea unguium: Secondary | ICD-10-CM

## 2023-12-25 DIAGNOSIS — I129 Hypertensive chronic kidney disease with stage 1 through stage 4 chronic kidney disease, or unspecified chronic kidney disease: Secondary | ICD-10-CM

## 2023-12-25 DIAGNOSIS — M79676 Pain in unspecified toe(s): Secondary | ICD-10-CM

## 2023-12-25 NOTE — Progress Notes (Signed)
This patient returns to my office for at risk foot care.  This patient requires this care by a professional since this patient will be at risk due to having CKD.  This patient is unable to cut nails herself since the patient cannot reach her nails.These nails are painful walking and wearing shoes.  This patient presents for at risk foot care today.  General Appearance  Alert, conversant and in no acute stress.  Vascular  Dorsalis pedis and posterior tibial  pulses are palpable  bilaterally.  Capillary return is within normal limits  bilaterally. Temperature is within normal limits  bilaterally.  Neurologic  Senn-Weinstein monofilament wire test within normal limits  bilaterally. Muscle power within normal limits bilaterally.  Nails Thick disfigured discolored nails with subungual debris  from hallux to fifth toes bilaterally. No evidence of bacterial infection or drainage bilaterally.  Orthopedic  No limitations of motion  feet .  No crepitus or effusions noted.  No bony pathology or digital deformities noted.  Skin  normotropic skin with no porokeratosis noted bilaterally.  No signs of infections or ulcers noted.     Onychomycosis  Pain in right toes  Pain in left toes  Consent was obtained for treatment procedures.   Mechanical debridement of nails 1-5  bilaterally performed with a nail nipper.  Filed with dremel without incident.    Return office visit     3 months                Told patient to return for periodic foot care and evaluation due to potential at risk complications.   Laydon Martis DPM   

## 2024-01-06 DIAGNOSIS — N184 Chronic kidney disease, stage 4 (severe): Secondary | ICD-10-CM | POA: Diagnosis not present

## 2024-01-22 DIAGNOSIS — N2581 Secondary hyperparathyroidism of renal origin: Secondary | ICD-10-CM | POA: Diagnosis not present

## 2024-01-22 DIAGNOSIS — D631 Anemia in chronic kidney disease: Secondary | ICD-10-CM | POA: Diagnosis not present

## 2024-01-22 DIAGNOSIS — N184 Chronic kidney disease, stage 4 (severe): Secondary | ICD-10-CM | POA: Diagnosis not present

## 2024-01-22 DIAGNOSIS — I129 Hypertensive chronic kidney disease with stage 1 through stage 4 chronic kidney disease, or unspecified chronic kidney disease: Secondary | ICD-10-CM | POA: Diagnosis not present

## 2024-02-11 DIAGNOSIS — E039 Hypothyroidism, unspecified: Secondary | ICD-10-CM | POA: Diagnosis not present

## 2024-03-06 DIAGNOSIS — N184 Chronic kidney disease, stage 4 (severe): Secondary | ICD-10-CM | POA: Diagnosis not present

## 2024-03-18 ENCOUNTER — Encounter: Payer: Self-pay | Admitting: Podiatry

## 2024-03-18 ENCOUNTER — Ambulatory Visit: Admitting: Podiatry

## 2024-03-18 DIAGNOSIS — I129 Hypertensive chronic kidney disease with stage 1 through stage 4 chronic kidney disease, or unspecified chronic kidney disease: Secondary | ICD-10-CM

## 2024-03-18 DIAGNOSIS — B351 Tinea unguium: Secondary | ICD-10-CM | POA: Diagnosis not present

## 2024-03-18 DIAGNOSIS — M79676 Pain in unspecified toe(s): Secondary | ICD-10-CM

## 2024-03-18 NOTE — Progress Notes (Signed)
This patient returns to my office for at risk foot care.  This patient requires this care by a professional since this patient will be at risk due to having CKD.  This patient is unable to cut nails herself since the patient cannot reach her nails.These nails are painful walking and wearing shoes.  This patient presents for at risk foot care today.  General Appearance  Alert, conversant and in no acute stress.  Vascular  Dorsalis pedis and posterior tibial  pulses are palpable  bilaterally.  Capillary return is within normal limits  bilaterally. Temperature is within normal limits  bilaterally.  Neurologic  Senn-Weinstein monofilament wire test within normal limits  bilaterally. Muscle power within normal limits bilaterally.  Nails Thick disfigured discolored nails with subungual debris  from hallux to fifth toes bilaterally. No evidence of bacterial infection or drainage bilaterally.  Orthopedic  No limitations of motion  feet .  No crepitus or effusions noted.  No bony pathology or digital deformities noted.  Skin  normotropic skin with no porokeratosis noted bilaterally.  No signs of infections or ulcers noted.     Onychomycosis  Pain in right toes  Pain in left toes  Consent was obtained for treatment procedures.   Mechanical debridement of nails 1-5  bilaterally performed with a nail nipper.  Filed with dremel without incident.    Return office visit     3 months                Told patient to return for periodic foot care and evaluation due to potential at risk complications.   Laydon Martis DPM   

## 2024-03-31 DIAGNOSIS — Z79899 Other long term (current) drug therapy: Secondary | ICD-10-CM | POA: Diagnosis not present

## 2024-03-31 DIAGNOSIS — H353132 Nonexudative age-related macular degeneration, bilateral, intermediate dry stage: Secondary | ICD-10-CM | POA: Diagnosis not present

## 2024-03-31 DIAGNOSIS — M069 Rheumatoid arthritis, unspecified: Secondary | ICD-10-CM | POA: Diagnosis not present

## 2024-03-31 DIAGNOSIS — H47323 Drusen of optic disc, bilateral: Secondary | ICD-10-CM | POA: Diagnosis not present

## 2024-04-22 DIAGNOSIS — H47093 Other disorders of optic nerve, not elsewhere classified, bilateral: Secondary | ICD-10-CM | POA: Diagnosis not present

## 2024-04-22 DIAGNOSIS — D3131 Benign neoplasm of right choroid: Secondary | ICD-10-CM | POA: Diagnosis not present

## 2024-04-22 DIAGNOSIS — H353211 Exudative age-related macular degeneration, right eye, with active choroidal neovascularization: Secondary | ICD-10-CM | POA: Diagnosis not present

## 2024-04-22 DIAGNOSIS — H353132 Nonexudative age-related macular degeneration, bilateral, intermediate dry stage: Secondary | ICD-10-CM | POA: Diagnosis not present

## 2024-04-27 DIAGNOSIS — H353132 Nonexudative age-related macular degeneration, bilateral, intermediate dry stage: Secondary | ICD-10-CM | POA: Diagnosis not present

## 2024-04-27 DIAGNOSIS — D3132 Benign neoplasm of left choroid: Secondary | ICD-10-CM | POA: Diagnosis not present

## 2024-04-27 DIAGNOSIS — H47093 Other disorders of optic nerve, not elsewhere classified, bilateral: Secondary | ICD-10-CM | POA: Diagnosis not present

## 2024-04-27 DIAGNOSIS — H353211 Exudative age-related macular degeneration, right eye, with active choroidal neovascularization: Secondary | ICD-10-CM | POA: Diagnosis not present

## 2024-04-27 DIAGNOSIS — D3131 Benign neoplasm of right choroid: Secondary | ICD-10-CM | POA: Diagnosis not present

## 2024-05-14 DIAGNOSIS — N2581 Secondary hyperparathyroidism of renal origin: Secondary | ICD-10-CM | POA: Diagnosis not present

## 2024-05-14 DIAGNOSIS — N189 Chronic kidney disease, unspecified: Secondary | ICD-10-CM | POA: Diagnosis not present

## 2024-05-14 DIAGNOSIS — N184 Chronic kidney disease, stage 4 (severe): Secondary | ICD-10-CM | POA: Diagnosis not present

## 2024-05-14 DIAGNOSIS — I129 Hypertensive chronic kidney disease with stage 1 through stage 4 chronic kidney disease, or unspecified chronic kidney disease: Secondary | ICD-10-CM | POA: Diagnosis not present

## 2024-05-14 DIAGNOSIS — Z23 Encounter for immunization: Secondary | ICD-10-CM | POA: Diagnosis not present

## 2024-05-14 DIAGNOSIS — D631 Anemia in chronic kidney disease: Secondary | ICD-10-CM | POA: Diagnosis not present

## 2024-05-28 DIAGNOSIS — D3132 Benign neoplasm of left choroid: Secondary | ICD-10-CM | POA: Diagnosis not present

## 2024-05-28 DIAGNOSIS — H47093 Other disorders of optic nerve, not elsewhere classified, bilateral: Secondary | ICD-10-CM | POA: Diagnosis not present

## 2024-05-28 DIAGNOSIS — H353211 Exudative age-related macular degeneration, right eye, with active choroidal neovascularization: Secondary | ICD-10-CM | POA: Diagnosis not present

## 2024-06-09 DIAGNOSIS — M0600 Rheumatoid arthritis without rheumatoid factor, unspecified site: Secondary | ICD-10-CM | POA: Diagnosis not present

## 2024-06-09 DIAGNOSIS — M1009 Idiopathic gout, multiple sites: Secondary | ICD-10-CM | POA: Diagnosis not present

## 2024-06-09 DIAGNOSIS — Z79899 Other long term (current) drug therapy: Secondary | ICD-10-CM | POA: Diagnosis not present

## 2024-06-09 DIAGNOSIS — M1991 Primary osteoarthritis, unspecified site: Secondary | ICD-10-CM | POA: Diagnosis not present

## 2024-06-18 ENCOUNTER — Ambulatory Visit: Admitting: Podiatry

## 2024-06-22 DIAGNOSIS — D122 Benign neoplasm of ascending colon: Secondary | ICD-10-CM | POA: Diagnosis not present

## 2024-06-22 DIAGNOSIS — Z1211 Encounter for screening for malignant neoplasm of colon: Secondary | ICD-10-CM | POA: Diagnosis not present

## 2024-06-23 ENCOUNTER — Encounter: Payer: Self-pay | Admitting: Podiatry

## 2024-06-23 ENCOUNTER — Ambulatory Visit: Admitting: Podiatry

## 2024-06-23 ENCOUNTER — Other Ambulatory Visit: Payer: Self-pay | Admitting: Lab

## 2024-06-23 DIAGNOSIS — M79676 Pain in unspecified toe(s): Secondary | ICD-10-CM

## 2024-06-23 DIAGNOSIS — I129 Hypertensive chronic kidney disease with stage 1 through stage 4 chronic kidney disease, or unspecified chronic kidney disease: Secondary | ICD-10-CM | POA: Diagnosis not present

## 2024-06-23 DIAGNOSIS — B351 Tinea unguium: Secondary | ICD-10-CM

## 2024-06-23 DIAGNOSIS — R21 Rash and other nonspecific skin eruption: Secondary | ICD-10-CM

## 2024-06-23 MED ORDER — TRIAMCINOLONE ACETONIDE 0.1 % EX CREA: TOPICAL_CREAM | CUTANEOUS | 0 refills | Status: AC

## 2024-06-23 NOTE — Progress Notes (Signed)
 This patient returns to my office for at risk foot care.  This patient requires this care by a professional since this patient will be at risk due to having CKD.    This patient is unable to cut nails herself since the patient cannot reach her nails.These nails are painful walking and wearing shoes.  This patient presents for at risk foot care today.  General Appearance  Alert, conversant and in no acute stress.  Vascular  Dorsalis pedis and posterior tibial  pulses are palpable  bilaterally.  Capillary return is within normal limits  bilaterally. Temperature is within normal limits  bilaterally.  Neurologic  Senn-Weinstein monofilament wire test within normal limits  bilaterally. Muscle power within normal limits bilaterally.  Nails Thick disfigured discolored nails with subungual debris  from hallux to fifth toes bilaterally. No evidence of bacterial infection or drainage bilaterally.  Orthopedic  No limitations of motion  feet .  No crepitus or effusions noted.  No bony pathology or digital deformities noted.  Skin  normotropic skin with no porokeratosis noted bilaterally.  No signs of infections or ulcers noted.     Onychomycosis  Pain in right toes  Pain in left toes  Consent was obtained for treatment procedures.   Mechanical debridement of nails 1-5  bilaterally performed with a nail nipper.  Filed with dremel without incident. Refill triamcinalone for this patient.   Return office visit     3 months                 Told patient to return for periodic foot care and evaluation due to potential at risk complications.   Cordella Bold DPM

## 2024-06-24 DIAGNOSIS — D122 Benign neoplasm of ascending colon: Secondary | ICD-10-CM | POA: Diagnosis not present

## 2024-06-29 DIAGNOSIS — H353132 Nonexudative age-related macular degeneration, bilateral, intermediate dry stage: Secondary | ICD-10-CM | POA: Diagnosis not present

## 2024-06-29 DIAGNOSIS — D3131 Benign neoplasm of right choroid: Secondary | ICD-10-CM | POA: Diagnosis not present

## 2024-06-29 DIAGNOSIS — H353211 Exudative age-related macular degeneration, right eye, with active choroidal neovascularization: Secondary | ICD-10-CM | POA: Diagnosis not present

## 2024-06-29 DIAGNOSIS — H47093 Other disorders of optic nerve, not elsewhere classified, bilateral: Secondary | ICD-10-CM | POA: Diagnosis not present

## 2024-06-29 DIAGNOSIS — D3132 Benign neoplasm of left choroid: Secondary | ICD-10-CM | POA: Diagnosis not present

## 2024-07-16 DIAGNOSIS — R635 Abnormal weight gain: Secondary | ICD-10-CM | POA: Diagnosis not present

## 2024-07-16 DIAGNOSIS — D849 Immunodeficiency, unspecified: Secondary | ICD-10-CM | POA: Diagnosis not present

## 2024-07-16 DIAGNOSIS — M0600 Rheumatoid arthritis without rheumatoid factor, unspecified site: Secondary | ICD-10-CM | POA: Diagnosis not present

## 2024-07-16 DIAGNOSIS — D489 Neoplasm of uncertain behavior, unspecified: Secondary | ICD-10-CM | POA: Diagnosis not present

## 2024-09-23 ENCOUNTER — Ambulatory Visit: Admitting: Podiatry
